# Patient Record
Sex: Female | Born: 1990 | Race: White | Hispanic: No | Marital: Married | Smoking: Former smoker
Health system: Southern US, Community
[De-identification: ages and names within clinical notes are randomized; demographics above are authoritative.]

## PROBLEM LIST (undated history)

## (undated) DIAGNOSIS — F32A Depression, unspecified: Secondary | ICD-10-CM

## (undated) DIAGNOSIS — F1111 Opioid abuse, in remission: Secondary | ICD-10-CM

## (undated) DIAGNOSIS — Z789 Other specified health status: Secondary | ICD-10-CM

## (undated) HISTORY — DX: Other specified health status: Z78.9

## (undated) HISTORY — DX: Depression, unspecified: F32.A

## (undated) HISTORY — DX: Opioid abuse, in remission: F11.11

---
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
## 2009-06-28 ENCOUNTER — Ambulatory Visit: Payer: Self-pay | Admitting: Psychiatry

## 2009-06-28 ENCOUNTER — Inpatient Hospital Stay (HOSPITAL_COMMUNITY): Admission: AD | Admit: 2009-06-28 | Discharge: 2009-06-28 | Payer: Self-pay | Admitting: Psychiatry

## 2009-06-29 ENCOUNTER — Inpatient Hospital Stay (HOSPITAL_COMMUNITY): Admission: EM | Admit: 2009-06-29 | Discharge: 2009-07-02 | Payer: Self-pay | Admitting: Emergency Medicine

## 2009-07-01 ENCOUNTER — Encounter (INDEPENDENT_AMBULATORY_CARE_PROVIDER_SITE_OTHER): Payer: Self-pay | Admitting: Internal Medicine

## 2009-07-02 ENCOUNTER — Inpatient Hospital Stay (HOSPITAL_COMMUNITY): Admission: AD | Admit: 2009-07-02 | Discharge: 2009-07-07 | Payer: Self-pay | Admitting: Psychiatry

## 2009-07-08 ENCOUNTER — Ambulatory Visit: Payer: Self-pay | Admitting: Psychiatry

## 2009-07-08 ENCOUNTER — Other Ambulatory Visit (HOSPITAL_COMMUNITY): Admission: RE | Admit: 2009-07-08 | Discharge: 2009-07-28 | Payer: Self-pay | Admitting: Psychiatry

## 2010-08-29 LAB — BASIC METABOLIC PANEL
BUN: 9 mg/dL (ref 6–23)
CO2: 22 mEq/L (ref 19–32)
CO2: 25 mEq/L (ref 19–32)
Calcium: 9.4 mg/dL (ref 8.4–10.5)
Chloride: 102 mEq/L (ref 96–112)
Chloride: 109 mEq/L (ref 96–112)
Creatinine, Ser: 0.84 mg/dL (ref 0.4–1.2)
GFR calc Af Amer: >60 mL/min (ref >60)
GFR calc Af Amer: >60 mL/min (ref >60)
GFR calc non Af Amer: >60 mL/min (ref >60)
Glucose, Bld: 95 mg/dL (ref 70–99)
Potassium: 3.4 mEq/L — ABNORMAL LOW (ref 3.5–5.1)
Potassium: 3.8 mEq/L (ref 3.5–5.1)
Sodium: 138 mEq/L (ref 135–145)

## 2010-08-29 LAB — CARDIAC PANEL(CRET KIN+CKTOT+MB+TROPI)
CK, MB: 1.9 ng/mL (ref 0.3–4.0)
Total CK: 178 U/L — ABNORMAL HIGH (ref 7–177)
Troponin I: 0.01 ng/mL (ref 0.00–0.06)

## 2010-08-29 LAB — D-DIMER, QUANTITATIVE: D-Dimer, Quant: <0.22 ug/mL-FEU (ref 0.00–0.48)

## 2010-08-29 LAB — URINE MICROSCOPIC-ADD ON

## 2010-08-29 LAB — URINALYSIS, ROUTINE W REFLEX MICROSCOPIC
Glucose, UA: NEGATIVE mg/dL
Ketones, ur: >80 mg/dL — AB
Leukocytes, UA: NEGATIVE
Nitrite: NEGATIVE
Protein, ur: 100 mg/dL — AB
Specific Gravity, Urine: 1.036 — ABNORMAL HIGH (ref 1.005–1.030)
Urobilinogen, UA: 1 mg/dL (ref 0.0–1.0)
pH: 6 (ref 5.0–8.0)

## 2010-08-29 LAB — COMPREHENSIVE METABOLIC PANEL
CO2: 25 mEq/L (ref 19–32)
Calcium: 8.4 mg/dL (ref 8.4–10.5)
Creatinine, Ser: 0.63 mg/dL (ref 0.4–1.2)
GFR calc Af Amer: >60 mL/min (ref >60)
GFR calc non Af Amer: >60 mL/min (ref >60)
Glucose, Bld: 114 mg/dL — ABNORMAL HIGH (ref 70–99)
Total Protein: 5.7 g/dL — ABNORMAL LOW (ref 6.0–8.3)

## 2010-08-29 LAB — CBC
HCT: 34.5 % — ABNORMAL LOW (ref 36.0–46.0)
Hemoglobin: 12 g/dL (ref 12.0–15.0)
MCHC: 34 g/dL (ref 30.0–36.0)
MCV: 91 fL (ref 78.0–100.0)
MCV: 91.5 fL (ref 78.0–100.0)
Platelets: 302 10*3/uL (ref 150–400)
RBC: 3.8 MIL/uL — ABNORMAL LOW (ref 3.87–5.11)
RDW: 12.3 % (ref 11.5–15.5)
WBC: 3.8 10*3/uL — ABNORMAL LOW (ref 4.0–10.5)

## 2010-08-29 LAB — RAPID URINE DRUG SCREEN, HOSP PERFORMED
Amphetamines: NOT DETECTED
Barbiturates: NOT DETECTED
Benzodiazepines: POSITIVE — AB
Cocaine: POSITIVE — AB
Opiates: NOT DETECTED
Tetrahydrocannabinol: POSITIVE — AB

## 2010-08-29 LAB — HEPATITIS PANEL, ACUTE: Hepatitis B Surface Ag: NEGATIVE

## 2010-08-29 LAB — DIFFERENTIAL
Basophils Absolute: 0.1 10*3/uL (ref 0.0–0.1)
Basophils Relative: 1 % (ref 0–1)
Eosinophils Absolute: 0 10*3/uL (ref 0.0–0.7)
Monocytes Relative: 10 % (ref 3–12)
Neutro Abs: 4.1 10*3/uL (ref 1.7–7.7)
Neutrophils Relative %: 66 % (ref 43–77)

## 2010-08-29 LAB — HEPATIC FUNCTION PANEL
AST: 20 U/L (ref 0–37)
Albumin: 3.3 g/dL — ABNORMAL LOW (ref 3.5–5.2)
Alkaline Phosphatase: 35 U/L — ABNORMAL LOW (ref 39–117)
Total Bilirubin: 1.3 mg/dL — ABNORMAL HIGH (ref 0.3–1.2)
Total Protein: 5.2 g/dL — ABNORMAL LOW (ref 6.0–8.3)

## 2010-08-29 LAB — HIV ANTIBODY (ROUTINE TESTING W REFLEX): HIV: NONREACTIVE

## 2010-08-29 LAB — POCT PREGNANCY, URINE: Preg Test, Ur: NEGATIVE

## 2010-08-29 LAB — TSH: TSH: 1.627 u[IU]/mL (ref 0.700–6.400)

## 2010-09-01 LAB — URINE DRUGS OF ABUSE SCREEN W ALC, ROUTINE (REF LAB)
Barbiturate Quant, Ur: NEGATIVE
Benzodiazepines.: NEGATIVE
Methadone: NEGATIVE
Phencyclidine (PCP): NEGATIVE
Propoxyphene: NEGATIVE

## 2011-09-17 ENCOUNTER — Emergency Department (HOSPITAL_COMMUNITY)
Admission: EM | Admit: 2011-09-17 | Discharge: 2011-09-17 | Disposition: A | Discharge status: Home / Self Care | Payer: Self-pay | Source: Home / Self Care | Attending: Family Medicine | Admitting: Family Medicine

## 2011-09-17 ENCOUNTER — Encounter (HOSPITAL_COMMUNITY): Payer: Self-pay | Admitting: *Deleted

## 2011-09-17 DIAGNOSIS — K645 Perianal venous thrombosis: Secondary | ICD-10-CM

## 2011-09-17 MED ORDER — TUCKS 50 % EX PADS: 1.0000 | MEDICATED_PAD | CUTANEOUS | Status: DC | PRN

## 2011-09-17 MED ORDER — DOCUSATE SODIUM 100 MG PO CAPS: 100.0000 mg | ORAL_CAPSULE | Freq: Two times a day (BID) | ORAL | Status: AC

## 2011-09-17 MED ORDER — HYDROCORTISONE ACE-PRAMOXINE 2.5-1 % RE CREA: TOPICAL_CREAM | Freq: Three times a day (TID) | RECTAL | Status: AC

## 2011-09-17 MED ORDER — POLYETHYLENE GLYCOL 3350 17 GM/SCOOP PO POWD: 17.0000 g | Freq: Every day | ORAL | Status: AC

## 2011-09-17 NOTE — ED Notes (Signed)
Pt with ? Hemorrhoid rectum onset Thursday - c/o mild discomfort - denies bleeding

## 2011-09-17 NOTE — Discharge Instructions (Signed)
Fiber Content in Foods Drinking plenty of fluids and consuming foods high in fiber can help with constipation. See the list below for the fiber content of some common foods. Starches and Grains / Dietary Fiber (g)  Cheerios, 1 cup / 3 g   Kellogg's Corn Flakes, 1 cup / 0.7 g   Rice Krispies, 1  cup / 0.3 g   Quaker Oat Life Cereal,  cup / 2.1 g   Oatmeal, instant (cooked),  cup / 2 g   Kellogg's Frosted Mini Wheats, 1 cup / 5.1 g   Rice, brown, long-grain (cooked), 1 cup / 3.5 g   Rice, white, long-grain (cooked), 1 cup / 0.6 g   Macaroni, cooked, enriched, 1 cup / 2.5 g  Legumes / Dietary Fiber (g)  Beans, baked, canned, plain or vegetarian,  cup / 5.2 g   Beans, kidney, canned,  cup / 6.8 g   Beans, pinto, dried (cooked),  cup / 7.7 g   Beans, pinto, canned,  cup / 5.5 g  Breads and Crackers / Dietary Fiber (g)  Graham crackers, plain or honey, 2 squares / 0.7 g   Saltine crackers, 3 squares / 0.3 g   Pretzels, plain, salted, 10 pieces / 1.8 g   Bread, whole-wheat, 1 slice / 1.9 g   Bread, white, 1 slice / 0.7 g   Bread, raisin, 1 slice / 1.2 g   Bagel, plain, 3 oz / 2 g   Tortilla, flour, 1 oz / 0.9 g   Tortilla, corn, 1 small / 1.5 g   Bun, hamburger or hotdog, 1 small / 0.9 g  Fruits / Dietary Fiber (g)  Apple, raw with skin, 1 medium / 4.4 g   Applesauce, sweetened,  cup / 1.5 g   Banana,  medium / 1.5 g   Grapes, 10 grapes / 0.4 g   Orange, 1 small / 2.3 g   Raisin, 1.5 oz / 1.6 g   Melon, 1 cup / 1.4 g  Vegetables / Dietary Fiber (g)  Green beans, canned,  cup / 1.3 g   Carrots (cooked),  cup / 2.3 g   Broccoli (cooked),  cup / 2.8 g   Peas, frozen (cooked),  cup / 4.4 g   Potatoes, mashed,  cup / 1.6 g   Lettuce, 1 cup / 0.5 g   Corn, canned,  cup / 1.6 g   Tomato,  cup / 1.1 g  Document Released: 10/15/2006 Document Revised: 05/18/2011 Document Reviewed: 12/10/2006 ExitCareHemorrhoids Hemorrhoids are  enlarged (dilated) veins around the rectum. There are 2 types of hemorrhoids, and the type of hemorrhoid is determined by its location. Internal hemorrhoids occur in the veins just inside the rectum.They are usually not painful, but they may bleed.However, they may poke through to the outside and become irritated and painful. External hemorrhoids involve the veins outside the anus and can be felt as a painful swelling or hard lump near the anus.They are often itchy and may crack and bleed. Sometimes clots will form in the veins. This makes them swollen and painful. These are called thrombosed hemorrhoids. CAUSES Causes of hemorrhoids include:  Pregnancy. This increases the pressure in the hemorrhoidal veins.   Constipation.   Straining to have a bowel movement.   Obesity.   Heavy lifting or other activity that caused you to strain.  TREATMENT Most of the time hemorrhoids improve in 1 to 2 weeks. However, if symptoms do not seem to be getting better  or if you have a lot of rectal bleeding, your caregiver may perform a procedure to help make the hemorrhoids get smaller or remove them completely.Possible treatments include:  Rubber band ligation. A rubber band is placed at the base of the hemorrhoid to cut off the circulation.   Sclerotherapy. A chemical is injected to shrink the hemorrhoid.   Infrared light therapy. Tools are used to burn the hemorrhoid.   Hemorrhoidectomy. This is surgical removal of the hemorrhoid.  HOME CARE INSTRUCTIONS   Increase fiber in your diet. Ask your caregiver about using fiber supplements.   Drink enough water and fluids to keep your urine clear or pale yellow.   Exercise regularly.   Go to the bathroom when you have the urge to have a bowel movement. Do not wait.   Avoid straining to have bowel movements.   Keep the anal area dry and clean.   Only take over-the-counter or prescription medicines for pain, discomfort, or fever as directed by your  caregiver.  If your hemorrhoids are thrombosed:  Take warm sitz baths for 20 to 30 minutes, 3 to 4 times per day.   If the hemorrhoids are very tender and swollen, place ice packs on the area as tolerated. Using ice packs between sitz baths may be helpful. Fill a plastic bag with ice. Place a towel between the bag of ice and your skin.   Medicated creams and suppositories may be used or applied as directed.   Do not use a donut-shaped pillow or sit on the toilet for long periods. This increases blood pooling and pain.  SEEK MEDICAL CARE IF:   You have increasing pain and swelling that is not controlled with your medicine.   You have uncontrolled bleeding.   You have difficulty or you are unable to have a bowel movement.   You have pain or inflammation outside the area of the hemorrhoids.   You have chills or an oral temperature above 102 F (38.9 C).  MAKE SURE YOU:   Understand these instructions.   Will watch your condition.   Will get help right away if you are not doing well or get worse.  Document Released: 05/26/2000 Document Revised: 05/18/2011 Document Reviewed: 10/01/2007 [facility] Patient Information 2012 [place], [place]. Patient Information 2012 [place], [place].

## 2011-09-17 NOTE — ED Provider Notes (Signed)
History     CSN: [id number]  Arrival date & time 09/17/11  1445   First MD Initiated Contact with Patient 09/17/11 1505      Chief Complaint  Patient presents with  . Hemorrhoids    (Consider location/radiation/quality/duration/timing/severity/associated sxs/prior treatment) The history is provided by the patient and a parent.   The patient reports rectal irritation since Thursday. She's tried compresses and several other over-the-counter medications without improvement. Because it's getting worse she decided to come in for evaluation. No rectal bleeding or blood in stool noted.   History reviewed. No pertinent past medical history.  History reviewed. No pertinent past surgical history.  History reviewed. No pertinent family history.  History  Substance Use Topics  . Smoking status: Current Everyday Smoker  . Smokeless tobacco: Not on file  . Alcohol Use: No    OB History    Grav Para Term Preterm Abortions TAB SAB Ect Mult Living                  Review of Systems  Gastrointestinal: Positive for rectal pain. Negative for vomiting, abdominal pain, blood in stool, abdominal distention and anal bleeding.    Allergies  Review of patient's allergies indicates no known allergies.  Home Medications  No current outpatient prescriptions on file.  BP 137/92  Pulse 93  Temp(Src) 98.7 F (37.1 C) (Oral)  Resp 18  SpO2 98%  LMP 08/30/2011  Physical Exam  Constitutional: She is oriented to person, place, and time. She appears well-developed.  HENT:  Head: Normocephalic.  Abdominal:       Rectal hemorrhoid present at 5:00 mildly thrombossed  Neurological: She is alert and oriented to person, place, and time. She has normal reflexes.  Skin: Skin is warm and dry. She is not diaphoretic.  Psychiatric: She has a normal mood and affect.    ED Course  Procedures (including critical care time)  Labs Reviewed - No data to display No results found.   Hemorrhoids Use  analpram apply 3 to 4 x aday miralalx daily  Colace 100 mg po q day  Use metamucil daily basis in the future    MDM          Hassan Rowan, MD 09/17/11 2147

## 2012-02-09 DIAGNOSIS — T1590XA Foreign body on external eye, part unspecified, unspecified eye, initial encounter: Secondary | ICD-10-CM | POA: Insufficient documentation

## 2012-02-09 DIAGNOSIS — F17200 Nicotine dependence, unspecified, uncomplicated: Secondary | ICD-10-CM | POA: Insufficient documentation

## 2012-02-09 DIAGNOSIS — S058X9A Other injuries of unspecified eye and orbit, initial encounter: Secondary | ICD-10-CM | POA: Insufficient documentation

## 2012-02-10 ENCOUNTER — Encounter (HOSPITAL_COMMUNITY): Payer: Self-pay | Admitting: Adult Health

## 2012-02-10 ENCOUNTER — Emergency Department (HOSPITAL_COMMUNITY)
Admission: EM | Admit: 2012-02-10 | Discharge: 2012-02-10 | Disposition: A | Discharge status: Home / Self Care | Payer: Self-pay | Attending: Emergency Medicine | Admitting: Emergency Medicine

## 2012-02-10 DIAGNOSIS — S0500XA Injury of conjunctiva and corneal abrasion without foreign body, unspecified eye, initial encounter: Secondary | ICD-10-CM

## 2012-02-10 MED ORDER — HYDROCODONE-ACETAMINOPHEN 5-325 MG PO TABS
1.0000 | ORAL_TABLET | Freq: Once | ORAL | Status: AC
  Administered 2012-02-10: 1 via ORAL
  Filled 2012-02-10: qty 1

## 2012-02-10 MED ORDER — TOBRAMYCIN 0.3 % OP OINT
TOPICAL_OINTMENT | Freq: Two times a day (BID) | OPHTHALMIC | Status: DC
  Administered 2012-02-10: 03:00:00 via OPHTHALMIC
  Filled 2012-02-10: qty 3.5

## 2012-02-10 MED ORDER — TETRACAINE HCL 0.5 % OP SOLN
2.0000 [drp] | Freq: Once | OPHTHALMIC | Status: AC
  Administered 2012-02-10: 2 [drp] via OPHTHALMIC

## 2012-02-10 MED ORDER — FLUORESCEIN SODIUM 1 MG OP STRP
1.0000 | ORAL_STRIP | Freq: Once | OPHTHALMIC | Status: AC
  Administered 2012-02-10: 1 via OPHTHALMIC
  Filled 2012-02-10: qty 1

## 2012-02-10 MED ORDER — HYDROCODONE-ACETAMINOPHEN 5-325 MG PO TABS: 1.0000 | ORAL_TABLET | ORAL | Status: AC | PRN

## 2012-02-10 NOTE — ED Notes (Signed)
Prescription given with discharge instructions.  

## 2012-02-10 NOTE — ED Provider Notes (Signed)
Medical screening examination/treatment/procedure(s) were performed by non-physician practitioner and as supervising physician I was immediately available for consultation/collaboration.    Vida Roller, MD 02/10/12 [phone]

## 2012-02-10 NOTE — ED Provider Notes (Signed)
History     CSN: [id number]  Arrival date & time 02/09/12  2347   First MD Initiated Contact with Patient 02/10/12 0046      Chief Complaint  Patient presents with  . Eye Injury   HPI  History provided by the patient. Patient is a 21 year old female with no significant PMH who presents with complaints of left eye injury and pain. Patient states she was lying in bed with her dog around 9 PM when her dog became excited and positive or face. Patient states that he hit her left eye and since that time she has had sharp pain to the eye. Pain is worse with bright lights. She denies any vision loss. There is some tearing of the eye. There is no bleeding. she denies any other complaints.    History reviewed. No pertinent past medical history.  History reviewed. No pertinent past surgical history.  History reviewed. No pertinent family history.  History  Substance Use Topics  . Smoking status: Current Everyday Smoker  . Smokeless tobacco: Not on file  . Alcohol Use: No    OB History    Grav Para Term Preterm Abortions TAB SAB Ect Mult Living                  Review of Systems  Eyes: Positive for photophobia, pain, discharge and redness.  Neurological: Positive for headaches. Negative for light-headedness.    Allergies  Review of patient's allergies indicates no known allergies.  Home Medications  No current outpatient prescriptions on file.  BP 117/76  Pulse 79  Temp 98 F (36.7 C) (Oral)  Resp 14  SpO2 97%  Physical Exam  Nursing note and vitals reviewed. Constitutional: She is oriented to person, place, and time. She appears well-developed and well-nourished. No distress.  HENT:  Head: Normocephalic.  Eyes: EOM are normal. Pupils are equal, round, and reactive to light. No foreign bodies found. No foreign body present in the left eye. Left conjunctiva is injected. Left conjunctiva has no hemorrhage.  Slit lamp exam:      The left eye shows corneal abrasion and  fluorescein uptake. The left eye shows no corneal flare, no corneal ulcer, no foreign body, no hyphema, no hypopyon and no anterior chamber bulge.    Cardiovascular: Normal rate and regular rhythm.   Pulmonary/Chest: Effort normal and breath sounds normal.  Neurological: She is alert and oriented to person, place, and time.  Skin: Skin is warm and dry. No rash noted.  Psychiatric: She has a normal mood and affect. Her behavior is normal.    ED Course  Procedures     1. Corneal abrasion       MDM  1:45AM patient seen and evaluated. Patient with corneal abrasion on fluorescein. Tobramycin drops provided. Patient given prescriptions for Norco. Patient given ophthalmology followup.        Angus Seller, [place] 02/10/12 [phone]

## 2012-02-10 NOTE — ED Notes (Signed)
Reports dog scratch in left eye at 2100 tonight. Left eye red and tearful-c/o blurred vision and pain.

## 2012-02-10 NOTE — ED Notes (Signed)
The pt has pain in her lt eye.  Tearing since she had her eye scratched by her dog earlier today.  Triage nurse used some type drops in her eye ??

## 2012-02-10 NOTE — ED Notes (Signed)
Fluorescein strip at bedside 

## 2012-11-04 ENCOUNTER — Encounter (HOSPITAL_COMMUNITY): Payer: Self-pay | Admitting: *Deleted

## 2012-11-04 ENCOUNTER — Emergency Department (INDEPENDENT_AMBULATORY_CARE_PROVIDER_SITE_OTHER)
Admission: EM | Admit: 2012-11-04 | Discharge: 2012-11-04 | Disposition: A | Discharge status: Home / Self Care | Payer: 59 | Source: Home / Self Care | Attending: Emergency Medicine | Admitting: Emergency Medicine

## 2012-11-04 DIAGNOSIS — B079 Viral wart, unspecified: Secondary | ICD-10-CM

## 2012-11-04 NOTE — ED Provider Notes (Signed)
Chief Complaint:   Chief Complaint  Patient presents with  . Nose Problem    History of Present Illness:   Jessica Huffman is a-year-old female who has had a nine-month history of 2 warts on her right nostril. Is gradually increasing in size and is somewhat irritated. They have not been bleeding. She tried putting some Compound W on them yesterday and this seemed to irritate them and make them worse. They're not inside the nose.  Review of Systems:  Other than noted above, the patient denies any of the following symptoms: Systemic:  No fevers, chills, sweats, weight loss or gain, fatigue, or tiredness. Eye:  No redness, pain, discharge, itching, blurred vision, or diplopia. ENT:  No headache, nasal congestion, sneezing, itching, epistaxis, ear pain, congestion, decreased hearing, ringing in ears, vertigo, or tinnitus.  No oral lesions, sore throat, pain on swallowing, or hoarseness. Neck:  No mass, tenderness or adenopathy. Lungs:  No coughing, wheezing, or shortness of breath. Skin:  No rash or itching.  PMFSH:  Past medical history, family history, social history, meds, and allergies were reviewed.  Physical Exam:   Vital signs:  BP 125/91  Pulse 83  Temp(Src) 98.5 F (36.9 C) (Oral)  Resp 16  SpO2 100% General:  Alert and oriented.  In no distress.  Skin warm and dry. Eye:  PERRL, full EOMs, lids and conjunctiva normal.   ENT:  TMs and canals clear.  Nasal mucosa not congested and without drainage.  Mucous membranes moist, no oral lesions, normal dentition, pharynx clear.  No cranial or facial pain to palplation. She has 2 small warts just on the entrance to the right nostril. A tiny one anteriorly measuring 1-2 mm in size and a larger one on the septal portion of the anterior nostril measuring 3-4 mm in size. Neck:  Supple, full ROM.  No adenopathy, tenderness or mass.  Thyroid normal. Lungs:  Breath sounds clear and equal bilaterally.  No wheezes, rales or rhonchi. Heart:  Rhythm  regular, without extrasystoles.  No gallops or murmers. Skin:  Clear, warm and dry.  Procedure Note:  Verbal informed consent was obtained from the patient.  Risks and benefits were outlined with the patient.  Patient understands and accepts these risks.  Identity of the patient was confirmed verbally and by armband.    Procedure was performed as follows:  The 2 small warts were prepped with alcohol and anesthetized with a small amount of 2% Xylocaine without epinephrine and the warts were then cauterized with electrocautery. Patient tolerated this procedure well. Antibiotic ointment was applied and she was instructed in wound care.  Patient tolerated the procedure well without any immediate complications.  Assessment:  The encounter diagnosis was Warts.  These appear to be simple warts and should respond well to electrocautery. I gave her the name of Dr. Christia Reading to followup with if they recur.  Plan:   1.  The following meds were prescribed:   Discharge Medication List as of 11/04/2012  2:33 PM     2.  The patient was instructed in symptomatic care and handouts were given. 3.  The patient was told to return if becoming worse in any way, if no better in 3 or 4 days, and given some red flag symptoms such as increasing pain, evidence of infection such as swelling or purulent drainage that would indicate earlier return. 4.  Follow up with Dr. Christia Reading if these recur.    Reuben Likes, MD 11/04/12 [phone]

## 2012-11-04 NOTE — ED Notes (Signed)
Pt  Reports  She  Has  Had  A   Growth  In  Her  Nose       X   9  Months    She  staes  She  Put   Compound  w  Inside  Her  Nose  Which made  It  Worse  She  Now  Reports  Pain in the  Affected      nare

## 2012-12-25 ENCOUNTER — Encounter (HOSPITAL_COMMUNITY): Payer: Self-pay | Admitting: Emergency Medicine

## 2012-12-25 ENCOUNTER — Emergency Department (HOSPITAL_COMMUNITY)
Admission: EM | Admit: 2012-12-25 | Discharge: 2012-12-25 | Disposition: A | Discharge status: Home / Self Care | Payer: 59 | Attending: Emergency Medicine | Admitting: Emergency Medicine

## 2012-12-25 DIAGNOSIS — R1084 Generalized abdominal pain: Secondary | ICD-10-CM | POA: Insufficient documentation

## 2012-12-25 DIAGNOSIS — J3489 Other specified disorders of nose and nasal sinuses: Secondary | ICD-10-CM | POA: Insufficient documentation

## 2012-12-25 DIAGNOSIS — R197 Diarrhea, unspecified: Secondary | ICD-10-CM | POA: Insufficient documentation

## 2012-12-25 DIAGNOSIS — R6883 Chills (without fever): Secondary | ICD-10-CM | POA: Insufficient documentation

## 2012-12-25 DIAGNOSIS — R109 Unspecified abdominal pain: Secondary | ICD-10-CM

## 2012-12-25 DIAGNOSIS — R110 Nausea: Secondary | ICD-10-CM | POA: Insufficient documentation

## 2012-12-25 DIAGNOSIS — F17200 Nicotine dependence, unspecified, uncomplicated: Secondary | ICD-10-CM | POA: Insufficient documentation

## 2012-12-25 DIAGNOSIS — N898 Other specified noninflammatory disorders of vagina: Secondary | ICD-10-CM | POA: Insufficient documentation

## 2012-12-25 DIAGNOSIS — Z3202 Encounter for pregnancy test, result negative: Secondary | ICD-10-CM | POA: Insufficient documentation

## 2012-12-25 DIAGNOSIS — Z79899 Other long term (current) drug therapy: Secondary | ICD-10-CM | POA: Insufficient documentation

## 2012-12-25 DIAGNOSIS — R61 Generalized hyperhidrosis: Secondary | ICD-10-CM | POA: Insufficient documentation

## 2012-12-25 DIAGNOSIS — N949 Unspecified condition associated with female genital organs and menstrual cycle: Secondary | ICD-10-CM | POA: Insufficient documentation

## 2012-12-25 DIAGNOSIS — K625 Hemorrhage of anus and rectum: Secondary | ICD-10-CM | POA: Insufficient documentation

## 2012-12-25 LAB — CBC WITH DIFFERENTIAL/PLATELET
Basophils Absolute: 0 10*3/uL (ref 0.0–0.1)
Basophils Relative: 0 % (ref 0–1)
Eosinophils Absolute: 0.1 10*3/uL (ref 0.0–0.7)
Eosinophils Relative: 2 % (ref 0–5)
HCT: 41.3 % (ref 36.0–46.0)
Hemoglobin: 13.9 g/dL (ref 12.0–15.0)
Lymphocytes Relative: 15 % (ref 12–46)
Lymphs Abs: 0.9 10*3/uL (ref 0.7–4.0)
MCH: 29.2 pg (ref 26.0–34.0)
MCHC: 33.7 g/dL (ref 30.0–36.0)
MCV: 86.8 fL (ref 78.0–100.0)
Monocytes Absolute: 0.5 10*3/uL (ref 0.1–1.0)
Monocytes Relative: 7 % (ref 3–12)
Neutro Abs: 4.9 10*3/uL (ref 1.7–7.7)
Neutrophils Relative %: 76 % (ref 43–77)
Platelets: 244 10*3/uL (ref 150–400)
RBC: 4.76 MIL/uL (ref 3.87–5.11)
RDW: 13.1 % (ref 11.5–15.5)
WBC: 6.4 10*3/uL (ref 4.0–10.5)

## 2012-12-25 LAB — URINALYSIS, ROUTINE W REFLEX MICROSCOPIC
Bilirubin Urine: NEGATIVE
Glucose, UA: NEGATIVE mg/dL
Hgb urine dipstick: NEGATIVE
Ketones, ur: NEGATIVE mg/dL
Leukocytes, UA: NEGATIVE
Nitrite: NEGATIVE
Protein, ur: NEGATIVE mg/dL
Specific Gravity, Urine: 1.008 (ref 1.005–1.030)
Urobilinogen, UA: 0.2 mg/dL (ref 0.0–1.0)
pH: 6.5 (ref 5.0–8.0)

## 2012-12-25 LAB — POCT I-STAT, CHEM 8
Creatinine, Ser: 0.8 mg/dL (ref 0.50–1.10)
Glucose, Bld: 98 mg/dL (ref 70–99)
HCT: 46 % (ref 36.0–46.0)
Hemoglobin: 15.6 g/dL — ABNORMAL HIGH (ref 12.0–15.0)
Potassium: 3.9 mEq/L (ref 3.5–5.1)
Sodium: 139 mEq/L (ref 135–145)
TCO2: 23 mmol/L (ref 0–100)

## 2012-12-25 LAB — WET PREP, GENITAL
Clue Cells Wet Prep HPF POC: NONE SEEN
Trich, Wet Prep: NONE SEEN
Yeast Wet Prep HPF POC: NONE SEEN

## 2012-12-25 LAB — PREGNANCY, URINE: Preg Test, Ur: NEGATIVE

## 2012-12-25 MED ORDER — MORPHINE SULFATE 4 MG/ML IJ SOLN
6.0000 mg | Freq: Once | INTRAMUSCULAR | Status: DC
  Filled 2012-12-25: qty 2

## 2012-12-25 MED ORDER — DICYCLOMINE HCL 20 MG PO TABS: 20.0000 mg | ORAL_TABLET | Freq: Two times a day (BID) | ORAL | Status: DC

## 2012-12-25 MED ORDER — MORPHINE SULFATE 4 MG/ML IJ SOLN: 4.0000 mg | Freq: Once | INTRAMUSCULAR | Status: DC

## 2012-12-25 MED ORDER — MORPHINE SULFATE 4 MG/ML IJ SOLN
6.0000 mg | Freq: Once | INTRAMUSCULAR | Status: AC
  Administered 2012-12-25: 6 mg via INTRAVENOUS

## 2012-12-25 NOTE — ED Notes (Signed)
Pt reports nasal congestion all day yesterday with abdominal cramping that started at 9pm.  Pt reports cramps usually last 15 minutes, but this time continued longer.  Pt noticed at midnight she had blood in stools with diarrhea.  Nausea present, denies vomiting.

## 2012-12-25 NOTE — ED Provider Notes (Signed)
History    CSN: [id number] Arrival date & time 12/25/12  0906  First MD Initiated Contact with Patient 12/25/12 0913     Chief Complaint  Patient presents with  . Nasal Congestion  . Abdominal Cramping  . Rectal Bleeding   (Consider location/radiation/quality/duration/timing/severity/associated sxs/prior Treatment) HPI Pt is a 22yo female presenting with abdominal cramping and diarrhea that started around 2100 last night.  Pt states cramping is constant, 7/10, diffuse but worse in lower abdomen.  Pt also reports blood in stool and "small amount coming out in toilet."  States the act of having a BM does not hurt. Reports some cold sweats but did not take temperature.  Also c/o mild nausea and head cold.  Has not tried anything for abdominal cramping or diarrhea. Pt reports having Implanon in place, due to come out Aug 11th.  Denies urinary symptoms. Denies previous hx of similar pain.  Reports eating same meal as family last night but no one else is sick.  Denies recent travel.   History reviewed. No pertinent past medical history. History reviewed. No pertinent past surgical history. No family history on file. History  Substance Use Topics  . Smoking status: Current Every Day Smoker -- 0.50 packs/day    Types: Cigarettes  . Smokeless tobacco: Not on file  . Alcohol Use: Yes   OB History   Grav Para Term Preterm Abortions TAB SAB Ect Mult Living                 Review of Systems  Constitutional: Positive for chills and diaphoresis. Negative for fever and fatigue.  Gastrointestinal: Positive for nausea ( mild), abdominal pain (diffuse, worse in lower, cramping ), blood in stool and anal bleeding. Negative for vomiting.  Genitourinary: Positive for pelvic pain. Negative for dysuria, urgency, hematuria, flank pain, vaginal bleeding, vaginal discharge and vaginal pain.  All other systems reviewed and are negative.    Allergies  Review of patient's allergies indicates no known  allergies.  Home Medications   Current Outpatient Rx  Name  Route  Sig  Dispense  Refill  . OVER THE COUNTER MEDICATION   Oral   Take 10 mLs by mouth daily as needed (congestion).         . BusPIRone HCl (BUSPAR PO)   Oral   Take by mouth.         . dicyclomine (BENTYL) 20 MG tablet   Oral   Take 1 tablet (20 mg total) by mouth 2 (two) times daily.   20 tablet   0   . ValACYclovir HCl (VALTREX PO)   Oral   Take by mouth.          BP 125/93  Pulse 76  Temp(Src) 98.3 F (36.8 C) (Oral)  Resp 18  Ht 5\' 4"  (1.626 m)  Wt 137 lb (62.143 kg)  BMI 23.5 kg/m2  SpO2 99% Physical Exam  Nursing note and vitals reviewed. Constitutional: She appears well-developed and well-nourished.  Lying on exam bed, appears mildly uncomfortable   HENT:  Head: Normocephalic and atraumatic.  Eyes: Conjunctivae are normal. No scleral icterus.  Neck: Normal range of motion. Neck supple.  Cardiovascular: Normal rate, regular rhythm and normal heart sounds.   Pulmonary/Chest: Effort normal and breath sounds normal. No respiratory distress. She has no wheezes. She has no rales. She exhibits no tenderness.  Abdominal: Soft. Bowel sounds are normal. She exhibits no distension and no mass. There is tenderness ( diffuse, greatest in lower abdomen).  There is no rebound and no guarding.  Genitourinary: Rectum normal and uterus normal. Rectal exam shows no external hemorrhoid, no internal hemorrhoid, no fissure, no mass, no tenderness and anal tone normal. Guaiac negative stool. Pelvic exam was performed with patient supine. Cervix exhibits no motion tenderness, no discharge and no friability. Right adnexum displays no mass, no tenderness and no fullness. Left adnexum displays no mass, no tenderness and no fullness. Vaginal discharge ( thick white) found.  Musculoskeletal: Normal range of motion.  Neurological: She is alert.  Skin: Skin is warm and dry. She is not diaphoretic.    ED Course   Procedures (including critical care time) Labs Reviewed  WET PREP, GENITAL - Abnormal; Notable for the following:    WBC, Wet Prep HPF POC FEW (*)    All other components within normal limits  POCT I-STAT, CHEM 8 - Abnormal; Notable for the following:    BUN 3 (*)    Hemoglobin 15.6 (*)    All other components within normal limits  GC/CHLAMYDIA PROBE AMP  CBC WITH DIFFERENTIAL  URINALYSIS, ROUTINE W REFLEX MICROSCOPIC  PREGNANCY, URINE  OCCULT BLOOD X 1 CARD TO LAB, STOOL  OCCULT BLOOD, POC DEVICE   No results found. 1. Abdominal cramping     MDM  DDx: diverticulitis, gastroenteritis, hemorrhoids, gynecologic issues  Labs: unremarkable.  Pelvic exam unremarkable.  Pt states she does feel better after 6mg  of morphine.  Wet prep: nl. Vitals are WNL, pain easily managed, not concerning for surgical abdomen.  Pt is well hydrated.  Will discharge home. Likely due to viral enteritis.  Rx: bentyl.  Will discharge pt home and have her f/u with Dr. Tiburcio Pea in 2-3 days if symptoms not improving.  Return precautions given. Pt verbalized understanding and agreement with tx plan. Vitals: unremarkable. Discharged in stable condition.    Discussed pt with attending during ED encounter.   Junius Finner, PA-C 12/25/12 1204

## 2012-12-25 NOTE — ED Notes (Signed)
Pt ambulated to the restroom without difficulty for bowel movement.

## 2012-12-25 NOTE — ED Notes (Signed)
PA O'Malley at bedside for hemoccult. 

## 2012-12-25 NOTE — ED Notes (Signed)
MD at bedside. 

## 2012-12-26 LAB — GC/CHLAMYDIA PROBE AMP
CT Probe RNA: NEGATIVE
GC Probe RNA: NEGATIVE

## 2012-12-30 NOTE — ED Provider Notes (Signed)
Medical screening examination/treatment/procedure(s) were performed by non-physician practitioner and as supervising physician I was immediately available for consultation/collaboration.   Suzi Roots, MD 12/30/12 [phone]

## 2013-05-29 ENCOUNTER — Encounter (HOSPITAL_COMMUNITY): Payer: Self-pay | Admitting: Emergency Medicine

## 2013-05-29 DIAGNOSIS — Z3202 Encounter for pregnancy test, result negative: Secondary | ICD-10-CM | POA: Insufficient documentation

## 2013-05-29 DIAGNOSIS — R1110 Vomiting, unspecified: Secondary | ICD-10-CM | POA: Insufficient documentation

## 2013-05-29 DIAGNOSIS — R1084 Generalized abdominal pain: Secondary | ICD-10-CM | POA: Insufficient documentation

## 2013-05-29 DIAGNOSIS — R197 Diarrhea, unspecified: Secondary | ICD-10-CM | POA: Insufficient documentation

## 2013-05-29 DIAGNOSIS — R51 Headache: Secondary | ICD-10-CM | POA: Insufficient documentation

## 2013-05-29 DIAGNOSIS — F17200 Nicotine dependence, unspecified, uncomplicated: Secondary | ICD-10-CM | POA: Insufficient documentation

## 2013-05-29 LAB — URINALYSIS, ROUTINE W REFLEX MICROSCOPIC
Hgb urine dipstick: NEGATIVE
Nitrite: NEGATIVE
Protein, ur: NEGATIVE mg/dL
Urobilinogen, UA: 0.2 mg/dL (ref 0.0–1.0)

## 2013-05-29 LAB — COMPREHENSIVE METABOLIC PANEL
AST: 20 U/L (ref 0–37)
BUN: 8 mg/dL (ref 6–23)
CO2: 27 mEq/L (ref 19–32)
Calcium: 9.3 mg/dL (ref 8.4–10.5)
Chloride: 101 mEq/L (ref 96–112)
Creatinine, Ser: 0.64 mg/dL (ref 0.50–1.10)
GFR calc Af Amer: >90 mL/min (ref >90)
GFR calc non Af Amer: >90 mL/min (ref >90)
Glucose, Bld: 96 mg/dL (ref 70–99)
Total Bilirubin: 0.7 mg/dL (ref 0.3–1.2)

## 2013-05-29 LAB — CBC WITH DIFFERENTIAL/PLATELET
Eosinophils Relative: 4 % (ref 0–5)
HCT: 39.1 % (ref 36.0–46.0)
Hemoglobin: 13.6 g/dL (ref 12.0–15.0)
Lymphocytes Relative: 46 % (ref 12–46)
MCV: 86.3 fL (ref 78.0–100.0)
Monocytes Absolute: 0.4 10*3/uL (ref 0.1–1.0)
Monocytes Relative: 7 % (ref 3–12)
Neutro Abs: 2.1 10*3/uL (ref 1.7–7.7)
WBC: 5 10*3/uL (ref 4.0–10.5)

## 2013-05-29 NOTE — ED Notes (Signed)
Pt. reports nausea , vomitting and diarrhea with intermittent headache onset yesterday . Denies fever or chills.

## 2013-05-30 ENCOUNTER — Emergency Department (HOSPITAL_COMMUNITY)
Admission: EM | Admit: 2013-05-30 | Discharge: 2013-05-30 | Disposition: A | Discharge status: Home / Self Care | Payer: 59 | Attending: Emergency Medicine | Admitting: Emergency Medicine

## 2013-05-30 DIAGNOSIS — R1110 Vomiting, unspecified: Secondary | ICD-10-CM

## 2013-05-30 LAB — POCT PREGNANCY, URINE: Preg Test, Ur: NEGATIVE

## 2013-05-30 MED ORDER — METOCLOPRAMIDE HCL 10 MG PO TABS: 10.0000 mg | ORAL_TABLET | Freq: Four times a day (QID) | ORAL | Status: DC | PRN

## 2013-05-30 MED ORDER — LOPERAMIDE HCL 2 MG PO CAPS: ORAL_CAPSULE | ORAL | Status: DC

## 2013-05-30 MED ORDER — ONDANSETRON 8 MG PO TBDP: ORAL_TABLET | ORAL | Status: DC

## 2013-05-30 NOTE — ED Provider Notes (Signed)
CSN: [id number]     Arrival date & time 05/29/13  2147 History   First MD Initiated Contact with Patient 05/30/13 0157     Chief Complaint  Patient presents with  . Emesis  . Diarrhea   (Consider location/radiation/quality/duration/timing/severity/associated sxs/prior Treatment) HPI 2 days multiple episodes of nonbloody vomiting diarrhea, no constant or localized abdominal pain, has intermittent diffuse cramps, no abdominal pain now, no dysuria/vag bleed/ vag discharge. No treatment PTA. Only mild headache, not sudden or severe.  HCG negative  History reviewed. No pertinent past medical history. History reviewed. No pertinent past surgical history. No family history on file. History  Substance Use Topics  . Smoking status: Current Every Day Smoker -- 0.50 packs/day    Types: Cigarettes  . Smokeless tobacco: Not on file  . Alcohol Use: Yes   OB History   Grav Para Term Preterm Abortions TAB SAB Ect Mult Living                 Review of Systems 10 Systems reviewed and are negative for acute change except as noted in the HPI. Allergies  Review of patient's allergies indicates no known allergies.  Home Medications   Current Outpatient Rx  Name  Route  Sig  Dispense  Refill  . ibuprofen (ADVIL,MOTRIN) 200 MG tablet   Oral   Take 400 mg by mouth every 6 (six) hours as needed for headache.          . loperamide (IMODIUM) 2 MG capsule      Take two tabs po initially, then one tab after each loose stool: max 8 tabs in 24 hours   12 capsule   0   . metoCLOPramide (REGLAN) 10 MG tablet   Oral   Take 1 tablet (10 mg total) by mouth every 6 (six) hours as needed for nausea (nausea/headache).   6 tablet   0   . ondansetron (ZOFRAN ODT) 8 MG disintegrating tablet      8mg  ODT q4 hours prn nausea   4 tablet   0    BP 109/79  Pulse 88  Temp(Src) 97.8 F (36.6 C) (Oral)  Resp 16  Wt 130 lb 6 oz (59.138 kg)  SpO2 96% Physical Exam  Nursing note and vitals  reviewed. Constitutional:  Awake, alert, nontoxic appearance.  HENT:  Head: Atraumatic.  Eyes: Right eye exhibits no discharge. Left eye exhibits no discharge.  Neck: Neck supple.  Cardiovascular: Normal rate and regular rhythm.   No murmur heard. Pulmonary/Chest: Effort normal and breath sounds normal. No respiratory distress. She has no wheezes. She exhibits no tenderness.  Abdominal: Soft. Bowel sounds are normal. She exhibits no distension and no mass. There is no tenderness. There is no rebound and no guarding.  Musculoskeletal: She exhibits no tenderness.  Baseline ROM, no obvious new focal weakness.  Neurological: She is alert.  Mental status and motor strength appears baseline for patient and situation.  Skin: No rash noted.  Psychiatric: She has a normal mood and affect.    ED Course  Procedures (including critical care time) Patient informed of clinical course, understand medical decision-making process, and agree with plan. Labs Review Labs Reviewed  URINALYSIS, ROUTINE W REFLEX MICROSCOPIC - Abnormal; Notable for the following:    APPearance CLOUDY (*)    All other components within normal limits  CBC WITH DIFFERENTIAL  COMPREHENSIVE METABOLIC PANEL  POCT PREGNANCY, URINE   Imaging Review No results found.  EKG Interpretation   None  MDM   1. Vomiting and diarrhea    I doubt any other EMC precluding discharge at this time including, but not necessarily limited to the following:SBI.    Hurman Horn, MD 05/30/13 2220

## 2013-05-30 NOTE — ED Notes (Signed)
Pt alert, denies pain.  Reports periods of nausea/vomiting x 2 today. Unclear why, has increased stress with father beginning chemo on Monday.

## 2013-11-01 ENCOUNTER — Encounter (HOSPITAL_COMMUNITY): Payer: Self-pay | Admitting: Emergency Medicine

## 2013-11-01 ENCOUNTER — Emergency Department (INDEPENDENT_AMBULATORY_CARE_PROVIDER_SITE_OTHER)
Admission: EM | Admit: 2013-11-01 | Discharge: 2013-11-01 | Disposition: A | Discharge status: Home / Self Care | Payer: Self-pay | Source: Home / Self Care | Attending: Family Medicine | Admitting: Family Medicine

## 2013-11-01 DIAGNOSIS — K529 Noninfective gastroenteritis and colitis, unspecified: Secondary | ICD-10-CM

## 2013-11-01 DIAGNOSIS — K5289 Other specified noninfective gastroenteritis and colitis: Secondary | ICD-10-CM

## 2013-11-01 MED ORDER — ONDANSETRON 4 MG PO TBDP
4.0000 mg | ORAL_TABLET | Freq: Once | ORAL | Status: AC
  Administered 2013-11-01: 4 mg via ORAL

## 2013-11-01 MED ORDER — ONDANSETRON HCL 4 MG PO TABS: 4.0000 mg | ORAL_TABLET | Freq: Four times a day (QID) | ORAL | Status: DC

## 2013-11-01 MED ORDER — ONDANSETRON 4 MG PO TBDP
ORAL_TABLET | ORAL | Status: AC
  Filled 2013-11-01: qty 1

## 2013-11-01 NOTE — ED Provider Notes (Signed)
CSN: [id number]     Arrival date & time 11/01/13  1633 History   First MD Initiated Contact with Patient 11/01/13 1739     Chief Complaint  Patient presents with  . GI Problem   (Consider location/radiation/quality/duration/timing/severity/associated sxs/prior Treatment) Patient is a 23 y.o. female presenting with GI illness. The history is provided by the patient and a parent.  GI Problem This is a new problem. The current episode started more than 1 week ago. The problem has been gradually improving. Pertinent negatives include no abdominal pain. Associated symptoms comments: Mother with similar n/v/d. Pt works with public.[employer]    History reviewed. No pertinent past medical history. History reviewed. No pertinent past surgical history. No family history on file. History  Substance Use Topics  . Smoking status: Current Every Day Smoker -- 0.50 packs/day    Types: Cigarettes  . Smokeless tobacco: Not on file  . Alcohol Use: Yes   OB History   Grav Para Term Preterm Abortions TAB SAB Ect Mult Living                 Review of Systems  Constitutional: Negative.  Negative for fever.  HENT: Negative.   Respiratory: Negative.   Cardiovascular: Negative.   Gastrointestinal: Positive for nausea and diarrhea. Negative for vomiting, abdominal pain, blood in stool, abdominal distention and anal bleeding.  Genitourinary: Negative.     Allergies  Review of patient's allergies indicates no known allergies.  Home Medications   Prior to Admission medications   Medication Sig Start Date End Date Taking? Authorizing Provider  ibuprofen (ADVIL,MOTRIN) 200 MG tablet Take 400 mg by mouth every 6 (six) hours as needed for headache.     Historical Provider, MD  loperamide (IMODIUM) 2 MG capsule Take two tabs po initially, then one tab after each loose stool: max 8 tabs in 24 hours 05/30/13   Hurman Horn, MD  metoCLOPramide (REGLAN) 10 MG tablet Take 1 tablet (10 mg total) by mouth every 6 (six)  hours as needed for nausea (nausea/headache). 05/30/13   Hurman Horn, MD  ondansetron (ZOFRAN ODT) 8 MG disintegrating tablet 8mg  ODT q4 hours prn nausea 05/30/13   Hurman Horn, MD   BP 128/77  Pulse 85  Temp(Src) 97.6 F (36.4 C) (Oral)  SpO2 97%  LMP 10/25/2013 Physical Exam  Nursing note and vitals reviewed. Constitutional: She is oriented to person, place, and time. She appears well-developed and well-nourished.  HENT:  Mouth/Throat: Oropharynx is clear and moist.  Neck: Normal range of motion. Neck supple.  Pulmonary/Chest: Breath sounds normal.  Abdominal: Soft. She exhibits no distension and no mass. Bowel sounds are increased. There is tenderness in the epigastric area. There is no rigidity, no rebound, no guarding, no CVA tenderness and no tenderness at McBurney's point.  Lymphadenopathy:    She has no cervical adenopathy.  Neurological: She is alert and oriented to person, place, and time.  Skin: Skin is warm and dry.    ED Course  Procedures (including critical care time) Labs Review Labs Reviewed - No data to display  Imaging Review No results found.   MDM   1. Gastroenteritis, acute        Linna Hoff, MD 11/01/13 1816

## 2013-11-01 NOTE — Discharge Instructions (Signed)
Clear liquid , bland diet tonight as tolerated, advance on sun as improved, use medicine as needed, imodium for diarrhea.  return or see your doctor if any problems.

## 2014-04-23 ENCOUNTER — Emergency Department (INDEPENDENT_AMBULATORY_CARE_PROVIDER_SITE_OTHER)
Admission: EM | Admit: 2014-04-23 | Discharge: 2014-04-23 | Disposition: A | Discharge status: Home / Self Care | Payer: Self-pay | Source: Home / Self Care | Attending: Family Medicine | Admitting: Family Medicine

## 2014-04-23 ENCOUNTER — Encounter (HOSPITAL_COMMUNITY): Payer: Self-pay

## 2014-04-23 DIAGNOSIS — H6122 Impacted cerumen, left ear: Secondary | ICD-10-CM

## 2014-04-23 DIAGNOSIS — H6092 Unspecified otitis externa, left ear: Secondary | ICD-10-CM

## 2014-04-23 MED ORDER — NEOMYCIN-POLYMYXIN-HC 3.5-10000-1 OT SUSP: 4.0000 [drp] | Freq: Three times a day (TID) | OTIC | Status: DC

## 2014-04-23 NOTE — ED Provider Notes (Signed)
CSN: [id number]     Arrival date & time 04/23/14  1857 History   None    Chief Complaint  Patient presents with  . Otalgia   (Consider location/radiation/quality/duration/timing/severity/associated sxs/prior Treatment) HPI         23 year old female presents for evaluation of difficulty hearing out of her left ear and left ear pain. This is started 2 days ago and has been getting gradually worse. It feels like when she used to get swimmer's ear infections frequently when she was younger. No fever, chills, NVD. No recent illness. No recent travel or sick contacts. She has multiple ear piercings but none are recent.  History reviewed. No pertinent past medical history. History reviewed. No pertinent past surgical history. No family history on file. History  Substance Use Topics  . Smoking status: Current Every Day Smoker -- 0.50 packs/day    Types: Cigarettes  . Smokeless tobacco: Not on file  . Alcohol Use: Yes   OB History    No data available     Review of Systems  HENT: Positive for ear pain.   All other systems reviewed and are negative.   Allergies  Review of patient's allergies indicates no known allergies.  Home Medications   Prior to Admission medications   Medication Sig Start Date End Date Taking? Authorizing Provider  ibuprofen (ADVIL,MOTRIN) 200 MG tablet Take 400 mg by mouth every 6 (six) hours as needed for headache.     Historical Provider, MD  loperamide (IMODIUM) 2 MG capsule Take two tabs po initially, then one tab after each loose stool: max 8 tabs in 24 hours 05/30/13   Hurman HornJohn M Bednar, MD  metoCLOPramide (REGLAN) 10 MG tablet Take 1 tablet (10 mg total) by mouth every 6 (six) hours as needed for nausea (nausea/headache). 05/30/13   Hurman HornJohn M Bednar, MD  neomycin-polymyxin-hydrocortisone (CORTISPORIN) 3.5-10000-1 otic suspension Place 4 drops into the left ear 3 (three) times daily. 04/23/14   Graylon GoodZachary H Jenavee Laguardia, PA-C  ondansetron (ZOFRAN ODT) 8 MG disintegrating  tablet 8mg  ODT q4 hours prn nausea 05/30/13   Hurman HornJohn M Bednar, MD  ondansetron (ZOFRAN) 4 MG tablet Take 1 tablet (4 mg total) by mouth every 6 (six) hours. Prn n/v 11/01/13   Linna HoffJames D Kindl, MD   BP 109/56 mmHg  Pulse 88  Temp(Src) 98.9 F (37.2 C) (Oral)  Resp 16  SpO2 100% Physical Exam  Constitutional: She is oriented to person, place, and time. Vital signs are normal. She appears well-developed and well-nourished. No distress.  HENT:  Head: Normocephalic and atraumatic.  Right Ear: Tympanic membrane, external ear and ear canal normal.  Left Ear: There is drainage (the canal is swollen, slightly erythematous. Is occluded with cerumen.).  Nose: Nose normal.  Mouth/Throat: Oropharynx is clear and moist. No oropharyngeal exudate.  Pulmonary/Chest: Effort normal. No respiratory distress.  Lymphadenopathy:    She has no cervical adenopathy.  Neurological: She is alert and oriented to person, place, and time. She has normal strength. Coordination normal.  Skin: Skin is warm and dry. No rash noted. She is not diaphoretic.  Psychiatric: She has a normal mood and affect. Judgment normal.  Nursing note and vitals reviewed.   ED Course  Procedures (including critical care time) Labs Review Labs Reviewed - No data to display  Imaging Review No results found.   MDM   1. Otitis externa, left   2. Cerumen impaction, left    Cerumen rinsed out, there is some canal swelling but the tympanic  membrane is normal. Treat with Cortisporin drops. Follow-up when necessary if worsening   Meds ordered this encounter  Medications  . neomycin-polymyxin-hydrocortisone (CORTISPORIN) 3.5-10000-1 otic suspension    Sig: Place 4 drops into the left ear 3 (three) times daily.    Dispense:  10 mL    Refill:  0    Order Specific Question:  Supervising Provider    Answer:  Bradd CanaryKINDL, JAMES D [[id number]]       Graylon GoodZachary H Lugenia Assefa, PA-C 04/23/14 2034

## 2014-04-23 NOTE — ED Notes (Signed)
Patient complains of having left ear pain for the past two days Patient did state when younger she was prone to ear infections

## 2014-04-23 NOTE — Discharge Instructions (Signed)
Otitis Externa Otitis externa is a bacterial or fungal infection of the outer ear canal. This is the area from the eardrum to the outside of the ear. Otitis externa is sometimes called "swimmer's ear." CAUSES  Possible causes of infection include:  Swimming in dirty water.  Moisture remaining in the ear after swimming or bathing.  Mild injury (trauma) to the ear.  Objects stuck in the ear (foreign body).  Cuts or scrapes (abrasions) on the outside of the ear. SIGNS AND SYMPTOMS  The first symptom of infection is often itching in the ear canal. Later signs and symptoms may include swelling and redness of the ear canal, ear pain, and yellowish-white fluid (pus) coming from the ear. The ear pain may be worse when pulling on the earlobe. DIAGNOSIS  Your health care provider will perform a physical exam. A sample of fluid may be taken from the ear and examined for bacteria or fungi. TREATMENT  Antibiotic ear drops are often given for 10 to 14 days. Treatment may also include pain medicine or corticosteroids to reduce itching and swelling. HOME CARE INSTRUCTIONS   Apply antibiotic ear drops to the ear canal as prescribed by your health care provider.  Take medicines only as directed by your health care provider.  If you have diabetes, follow any additional treatment instructions from your health care provider.  Keep all follow-up visits as directed by your health care provider. PREVENTION   Keep your ear dry. Use the corner of a towel to absorb water out of the ear canal after swimming or bathing.  Avoid scratching or putting objects inside your ear. This can damage the ear canal or remove the protective wax that lines the canal. This makes it easier for bacteria and fungi to grow.  Avoid swimming in lakes, polluted water, or poorly chlorinated pools.  You may use ear drops made of rubbing alcohol and vinegar after swimming. Combine equal parts of white vinegar and alcohol in a bottle.  Put 3 or 4 drops into each ear after swimming. SEEK MEDICAL CARE IF:   You have a fever.  Your ear is still red, swollen, painful, or draining pus after 3 days.  Your redness, swelling, or pain gets worse.  You have a severe headache.  You have redness, swelling, pain, or tenderness in the area behind your ear. MAKE SURE YOU:   Understand these instructions.  Will watch your condition.  Will get help right away if you are not doing well or get worse. Document Released: 05/29/2005 Document Revised: 10/13/2013 Document Reviewed: 06/15/2011 ExitCare Patient Information 2015 [employer]. This information is not intended to replace advice given to you by your health care provider. Make sure you discuss any questions you have with your health care provider. Cerumen Impaction A cerumen impaction is when the wax in your ear forms a plug. This plug usually causes reduced hearing. Sometimes it also causes an earache or dizziness. Removing a cerumen impaction can be difficult and painful. The wax sticks to the ear canal. The canal is sensitive and bleeds easily. If you try to remove a heavy wax buildup with a cotton tipped swab, you may push it in further. Irrigation with water, suction, and small ear curettes may be used to clear out the wax. If the impaction is fixed to the skin in the ear canal, ear drops may be needed for a few days to loosen the wax. People who build up a lot of wax frequently can use ear wax   removal products available in your local drugstore. SEEK MEDICAL CARE IF:  You develop an earache, increased hearing loss, or marked dizziness. Document Released: 07/06/2004 Document Revised: 08/21/2011 Document Reviewed: 08/26/2009 ExitCare Patient Information 2015 [employer]. This information is not intended to replace advice given to you by your health care provider. Make sure you discuss any questions you have with your health care provider.  

## 2016-08-04 LAB — PREGNANCY, URINE: Preg Test, Ur: POSITIVE

## 2016-08-16 ENCOUNTER — Encounter: Payer: Self-pay | Admitting: *Deleted

## 2016-08-24 ENCOUNTER — Ambulatory Visit (INDEPENDENT_AMBULATORY_CARE_PROVIDER_SITE_OTHER): Payer: Medicaid Other | Admitting: Medical

## 2016-08-24 ENCOUNTER — Other Ambulatory Visit (HOSPITAL_COMMUNITY)
Admission: RE | Admit: 2016-08-24 | Discharge: 2016-08-24 | Disposition: A | Discharge status: Home / Self Care | Payer: Medicaid Other | Source: Ambulatory Visit | Attending: Medical | Admitting: Medical

## 2016-08-24 ENCOUNTER — Encounter: Payer: Self-pay | Admitting: Medical

## 2016-08-24 ENCOUNTER — Ambulatory Visit: Payer: Self-pay

## 2016-08-24 VITALS — BP 121/72 | HR 87 | Wt 163.6 lb

## 2016-08-24 DIAGNOSIS — F1120 Opioid dependence, uncomplicated: Secondary | ICD-10-CM | POA: Diagnosis not present

## 2016-08-24 DIAGNOSIS — Z113 Encounter for screening for infections with a predominantly sexual mode of transmission: Secondary | ICD-10-CM | POA: Insufficient documentation

## 2016-08-24 DIAGNOSIS — O3680X0 Pregnancy with inconclusive fetal viability, not applicable or unspecified: Secondary | ICD-10-CM

## 2016-08-24 DIAGNOSIS — Z3481 Encounter for supervision of other normal pregnancy, first trimester: Secondary | ICD-10-CM

## 2016-08-24 DIAGNOSIS — O0992 Supervision of high risk pregnancy, unspecified, second trimester: Secondary | ICD-10-CM | POA: Insufficient documentation

## 2016-08-24 DIAGNOSIS — O99321 Drug use complicating pregnancy, first trimester: Secondary | ICD-10-CM

## 2016-08-24 DIAGNOSIS — O99320 Drug use complicating pregnancy, unspecified trimester: Secondary | ICD-10-CM

## 2016-08-24 DIAGNOSIS — Z3401 Encounter for supervision of normal first pregnancy, first trimester: Secondary | ICD-10-CM

## 2016-08-24 NOTE — Patient Instructions (Signed)
First Trimester of Pregnancy The first trimester of pregnancy is from week 1 until the end of week 13 (months 1 through 3). During this time, your baby will begin to develop inside you. At 6-8 weeks, the eyes and face are formed, and the heartbeat can be seen on ultrasound. At the end of 12 weeks, all the baby's organs are formed. Prenatal care is all the medical care you receive before the birth of your baby. Make sure you get good prenatal care and follow all of your doctor's instructions. Follow these instructions at home: Medicines  Take over-the-counter and prescription medicines only as told by your doctor. Some medicines are safe and some medicines are not safe during pregnancy.  Take a prenatal vitamin that contains at least 600 micrograms (mcg) of folic acid.  If you have trouble pooping (constipation), take medicine that will make your stool soft (stool softener) if your doctor approves. Eating and drinking  Eat regular, healthy meals.  Your doctor will tell you the amount of weight gain that is right for you.  Avoid raw meat and uncooked cheese.  If you feel sick to your stomach (nauseous) or throw up (vomit): ? Eat 4 or 5 small meals a day instead of 3 large meals. ? Try eating a few soda crackers. ? Drink liquids between meals instead of during meals.  To prevent constipation: ? Eat foods that are high in fiber, like fresh fruits and vegetables, whole grains, and beans. ? Drink enough fluids to keep your pee (urine) clear or pale yellow. Activity  Exercise only as told by your doctor. Stop exercising if you have cramps or pain in your lower belly (abdomen) or low back.  Do not exercise if it is too hot, too humid, or if you are in a place of great height (high altitude).  Try to avoid standing for long periods of time. Move your legs often if you must stand in one place for a long time.  Avoid heavy lifting.  Wear low-heeled shoes. Sit and stand up straight.  You  can have sex unless your doctor tells you not to. Relieving pain and discomfort  Wear a good support bra if your breasts are sore.  Take warm water baths (sitz baths) to soothe pain or discomfort caused by hemorrhoids. Use hemorrhoid cream if your doctor says it is okay.  Rest with your legs raised if you have leg cramps or low back pain.  If you have puffy, bulging veins (varicose veins) in your legs: ? Wear support hose or compression stockings as told by your doctor. ? Raise (elevate) your feet for 15 minutes, 3-4 times a day. ? Limit salt in your food. Prenatal care  Schedule your prenatal visits by the twelfth week of pregnancy.  Write down your questions. Take them to your prenatal visits.  Keep all your prenatal visits as told by your doctor. This is important. Safety  Wear your seat belt at all times when driving.  Make a list of emergency phone numbers. The list should include numbers for family, friends, the hospital, and police and fire departments. General instructions  Ask your doctor for a referral to a local prenatal class. Begin classes no later than at the start of month 6 of your pregnancy.  Ask for help if you need counseling or if you need help with nutrition. Your doctor can give you advice or tell you where to go for help.  Do not use hot tubs, steam rooms, or   saunas.  Do not douche or use tampons or scented sanitary pads.  Do not cross your legs for long periods of time.  Avoid all herbs and alcohol. Avoid drugs that are not approved by your doctor.  Do not use any tobacco products, including cigarettes, chewing tobacco, and electronic cigarettes. If you need help quitting, ask your doctor. You may get counseling or other support to help you quit.  Avoid cat litter boxes and soil used by cats. These carry germs that can cause birth defects in the baby and can cause a loss of your baby (miscarriage) or stillbirth.  Visit your dentist. At home, brush  your teeth with a soft toothbrush. Be gentle when you floss. Contact a doctor if:  You are dizzy.  You have mild cramps or pressure in your lower belly.  You have a nagging pain in your belly area.  You continue to feel sick to your stomach, you throw up, or you have watery poop (diarrhea).  You have a bad smelling fluid coming from your vagina.  You have pain when you pee (urinate).  You have increased puffiness (swelling) in your face, hands, legs, or ankles. Get help right away if:  You have a fever.  You are leaking fluid from your vagina.  You have spotting or bleeding from your vagina.  You have very bad belly cramping or pain.  You gain or lose weight rapidly.  You throw up blood. It may look like coffee grounds.  You are around people who have German measles, fifth disease, or chickenpox.  You have a very bad headache.  You have shortness of breath.  You have any kind of trauma, such as from a fall or a car accident. Summary  The first trimester of pregnancy is from week 1 until the end of week 13 (months 1 through 3).  To take care of yourself and your unborn baby, you will need to eat healthy meals, take medicines only if your doctor tells you to do so, and do activities that are safe for you and your baby.  Keep all follow-up visits as told by your doctor. This is important as your doctor will have to ensure that your baby is healthy and growing well. This information is not intended to replace advice given to you by your health care provider. Make sure you discuss any questions you have with your health care provider. Document Released: 11/15/2007 Document Revised: 06/06/2016 Document Reviewed: 06/06/2016 Elsevier Interactive Patient Education  2017 [employer]  

## 2016-08-24 NOTE — Progress Notes (Signed)
   PRENATAL VISIT NOTE  Subjective:  Jessica Huffman is a 26 y.o. G2P0010 at 3259w3d being seen today for her initial prenatal care visit.  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  Patient reports no complaints.   . Vag. Bleeding: None.   . Denies leaking of fluid.   The following portions of the patient's history were reviewed and updated as appropriate: allergies, current medications, past family history, past medical history, past social history, past surgical history and problem list. Problem list updated.  Objective:   Vitals:   08/24/16 1000  BP: 121/72  Pulse: 87  Weight: 163 lb 9.6 oz (74.2 kg)    Fetal Status: Fetal Heart Rate (bpm): 154         General:  Alert, oriented and cooperative. Patient is in no acute distress.  Skin: Skin is warm and dry. No rash noted.   Cardiovascular: Normal heart rate noted  Respiratory: Normal respiratory effort, no problems with respiration noted  Abdomen: Soft, gravid, appropriate for gestational age. Pain/Pressure: Absent     Pelvic:  Cervical exam deferred        Extremities: Normal range of motion.  Edema: None  Mental Status: Normal mood and affect. Normal behavior. Normal judgment and thought content.   Assessment and Plan:  Pregnancy: G2P0010 at 4259w3d  1. Encounter to determine fetal viability of pregnancy, single or unspecified fetus - US OB Limited; scheduled   2. Supervision of low-risk first pregnancy, first trimester - US MFM Fetal Nuchal Translucency; scheduled - Obstetric Panel, Including HIV - Cervicovaginal ancillary only - Culture, OB Urine  3. 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 Please refer to After Visit Summary for other counseling recommendations.  Return in about 4 weeks (around 09/21/2016) for LOB.   Marny LowensteinJulie N Hanan Mcwilliams, PA-C

## 2016-08-24 NOTE — Progress Notes (Signed)
Pt informed that the ultrasound is considered a limited OB ultrasound and is not intended to be a complete ultrasound exam.  Patient also informed that the ultrasound is not being completed with the intent of assessing for fetal or placental anomalies or any pelvic abnormalities.  Explained that the purpose of today's ultrasound is to assess for viability.  Patient acknowledges the purpose of the exam and the limitations of the study.   Single IUP FHR - 158 bpm per M-mode CRL - 3.98 cm (10w 6d)

## 2016-08-24 NOTE — Progress Notes (Signed)
Initial prenatal education packet given Breastfeeding education done Declines flu Wants first trimester screen Initial prenatal labs ROI for pap results (Dr Ellyn HackBovard)

## 2016-08-25 LAB — OBSTETRIC PANEL, INCLUDING HIV
ANTIBODY SCREEN: NEGATIVE
BASOS ABS: 0 10*3/uL (ref 0.0–0.2)
BASOS: 0 %
EOS (ABSOLUTE): 0.2 10*3/uL (ref 0.0–0.4)
Eos: 3 %
HEMATOCRIT: 38.4 % (ref 34.0–46.6)
HEP B S AG: NEGATIVE
HIV Screen 4th Generation wRfx: NONREACTIVE
Hemoglobin: 12.5 g/dL (ref 11.1–15.9)
IMMATURE GRANS (ABS): 0 10*3/uL (ref 0.0–0.1)
Immature Granulocytes: 0 %
LYMPHS: 35 %
Lymphocytes Absolute: 1.9 10*3/uL (ref 0.7–3.1)
MCH: 28.2 pg (ref 26.6–33.0)
MCHC: 32.6 g/dL (ref 31.5–35.7)
MCV: 87 fL (ref 79–97)
MONOCYTES: 7 %
Monocytes Absolute: 0.4 10*3/uL (ref 0.1–0.9)
Neutrophils Absolute: 3 10*3/uL (ref 1.4–7.0)
Neutrophils: 55 %
Platelets: 313 10*3/uL (ref 150–379)
RBC: 4.43 x10E6/uL (ref 3.77–5.28)
RDW: 14.6 % (ref 12.3–15.4)
RPR Ser Ql: NONREACTIVE
RUBELLA: 2.7 {index} (ref >0.99)
Rh Factor: POSITIVE
WBC: 5.5 10*3/uL (ref 3.4–10.8)

## 2016-08-25 LAB — POCT URINALYSIS DIP (DEVICE)
Bilirubin Urine: NEGATIVE
GLUCOSE, UA: NEGATIVE mg/dL
Hgb urine dipstick: NEGATIVE
KETONES UR: NEGATIVE mg/dL
LEUKOCYTES UA: NEGATIVE
Nitrite: NEGATIVE
Protein, ur: NEGATIVE mg/dL
SPECIFIC GRAVITY, URINE: 1.015 (ref 1.005–1.030)
Urobilinogen, UA: 0.2 mg/dL (ref 0.0–1.0)
pH: 6.5 (ref 5.0–8.0)

## 2016-08-26 LAB — CERVICOVAGINAL ANCILLARY ONLY
CHLAMYDIA, DNA PROBE: NEGATIVE
NEISSERIA GONORRHEA: NEGATIVE

## 2016-08-27 LAB — URINE CULTURE, OB REFLEX

## 2016-08-27 LAB — CULTURE, OB URINE

## 2016-08-28 ENCOUNTER — Other Ambulatory Visit: Payer: Self-pay | Admitting: Medical

## 2016-08-28 ENCOUNTER — Encounter: Payer: Self-pay | Admitting: General Practice

## 2016-08-28 DIAGNOSIS — B952 Enterococcus as the cause of diseases classified elsewhere: Secondary | ICD-10-CM

## 2016-08-28 DIAGNOSIS — N390 Urinary tract infection, site not specified: Principal | ICD-10-CM

## 2016-08-28 MED ORDER — CEPHALEXIN 500 MG PO CAPS: 500.0000 mg | ORAL_CAPSULE | Freq: Four times a day (QID) | ORAL | 0 refills | Status: DC

## 2016-09-02 ENCOUNTER — Emergency Department (HOSPITAL_COMMUNITY)
Admission: EM | Admit: 2016-09-02 | Discharge: 2016-09-02 | Disposition: A | Discharge status: Home / Self Care | Payer: Medicaid Other | Attending: Emergency Medicine | Admitting: Emergency Medicine

## 2016-09-02 ENCOUNTER — Emergency Department (HOSPITAL_COMMUNITY): Payer: Medicaid Other

## 2016-09-02 ENCOUNTER — Encounter (HOSPITAL_COMMUNITY): Payer: Self-pay | Admitting: Radiology

## 2016-09-02 DIAGNOSIS — Y999 Unspecified external cause status: Secondary | ICD-10-CM | POA: Insufficient documentation

## 2016-09-02 DIAGNOSIS — Z3A12 12 weeks gestation of pregnancy: Secondary | ICD-10-CM | POA: Insufficient documentation

## 2016-09-02 DIAGNOSIS — O99331 Smoking (tobacco) complicating pregnancy, first trimester: Secondary | ICD-10-CM | POA: Insufficient documentation

## 2016-09-02 DIAGNOSIS — O9A211 Injury, poisoning and certain other consequences of external causes complicating pregnancy, first trimester: Secondary | ICD-10-CM | POA: Insufficient documentation

## 2016-09-02 DIAGNOSIS — M545 Low back pain: Secondary | ICD-10-CM | POA: Insufficient documentation

## 2016-09-02 DIAGNOSIS — F17200 Nicotine dependence, unspecified, uncomplicated: Secondary | ICD-10-CM | POA: Diagnosis not present

## 2016-09-02 DIAGNOSIS — Y939 Activity, unspecified: Secondary | ICD-10-CM | POA: Diagnosis not present

## 2016-09-02 DIAGNOSIS — Y92410 Unspecified street and highway as the place of occurrence of the external cause: Secondary | ICD-10-CM | POA: Diagnosis not present

## 2016-09-02 MED ORDER — SODIUM CHLORIDE 0.9 % IV BOLUS (SEPSIS)
1000.0000 mL | Freq: Once | INTRAVENOUS | Status: AC
  Administered 2016-09-02: 1000 mL via INTRAVENOUS

## 2016-09-02 NOTE — ED Notes (Signed)
The pts parents are here at the bedside
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
## 2016-09-02 NOTE — ED Notes (Signed)
Portable chest x-ray

## 2016-09-02 NOTE — ED Notes (Signed)
Ultra sound of abd by the ed res  She is unable to count a heart beat 12- weeks pregnant
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
## 2016-09-02 NOTE — ED Notes (Signed)
Pt resting and relaxing
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
## 2016-09-02 NOTE — ED Notes (Signed)
Up walking to the br  Pain still the same
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
## 2016-09-02 NOTE — ED Notes (Signed)
The pt s hands washed with soap and water no other cuts or abrasions noted

## 2016-09-02 NOTE — Discharge Instructions (Signed)
Tylenol for pain

## 2016-09-03 ENCOUNTER — Encounter (HOSPITAL_COMMUNITY): Payer: Self-pay

## 2016-09-03 ENCOUNTER — Encounter: Payer: Self-pay | Admitting: Medical

## 2016-09-05 ENCOUNTER — Telehealth: Payer: Self-pay | Admitting: General Practice
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
## 2016-09-14 ENCOUNTER — Encounter (HOSPITAL_COMMUNITY): Payer: Self-pay

## 2016-09-15 ENCOUNTER — Other Ambulatory Visit: Payer: Self-pay | Admitting: Medical

## 2016-09-15 ENCOUNTER — Encounter (HOSPITAL_COMMUNITY): Payer: Self-pay

## 2016-09-15 ENCOUNTER — Ambulatory Visit (HOSPITAL_COMMUNITY)
Admission: RE | Admit: 2016-09-15 | Discharge: 2016-09-15 | Disposition: A | Discharge status: Home / Self Care | Payer: Medicaid Other | Source: Ambulatory Visit | Attending: Medical | Admitting: Medical

## 2016-09-15 ENCOUNTER — Ambulatory Visit (HOSPITAL_COMMUNITY): Admission: RE | Admit: 2016-09-15 | Payer: Medicaid Other | Source: Ambulatory Visit

## 2016-09-15 DIAGNOSIS — Z3401 Encounter for supervision of normal first pregnancy, first trimester: Secondary | ICD-10-CM

## 2016-09-15 DIAGNOSIS — O99322 Drug use complicating pregnancy, second trimester: Secondary | ICD-10-CM | POA: Insufficient documentation

## 2016-09-15 DIAGNOSIS — Z3A14 14 weeks gestation of pregnancy: Secondary | ICD-10-CM

## 2016-09-21 ENCOUNTER — Ambulatory Visit (INDEPENDENT_AMBULATORY_CARE_PROVIDER_SITE_OTHER): Payer: Medicaid Other | Admitting: Medical

## 2016-09-21 VITALS — BP 124/67 | HR 87 | Wt 171.0 lb

## 2016-09-21 DIAGNOSIS — Z3401 Encounter for supervision of normal first pregnancy, first trimester: Secondary | ICD-10-CM | POA: Diagnosis not present

## 2016-09-21 DIAGNOSIS — F1120 Opioid dependence, uncomplicated: Secondary | ICD-10-CM | POA: Diagnosis not present

## 2016-09-21 DIAGNOSIS — O99321 Drug use complicating pregnancy, first trimester: Secondary | ICD-10-CM | POA: Diagnosis not present

## 2016-09-21 NOTE — Progress Notes (Signed)
   PRENATAL VISIT NOTE  Subjective:  Jessica Huffman is a 26 y.o. G2P0010 at [redacted]w[redacted]d being seen today for ongoing prenatal care.  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  Patient reports no complaints.   . Vag. Bleeding: None.  Movement: Absent. Denies leaking of fluid.   The following portions of the patient's history were reviewed and updated as appropriate: allergies, current medications, past family history, past medical history, past social history, past surgical history and problem list. Problem list updated.  Objective:   Vitals:   09/21/16 0946  BP: 124/67  Pulse: 87  Weight: 171 lb (77.6 kg)    Fetal Status: Fetal Heart Rate (bpm): 140   Movement: Absent     General:  Alert, oriented and cooperative. Patient is in no acute distress.  Skin: Skin is warm and dry. No rash noted.   Cardiovascular: Normal heart rate noted  Respiratory: Normal respiratory effort, no problems with respiration noted  Abdomen: Soft, gravid, appropriate for gestational age. Pain/Pressure: Absent     Pelvic:  Cervical exam deferred        Extremities: Normal range of motion.     Mental Status: Normal mood and affect. Normal behavior. Normal judgment and thought content.   Assessment and Plan:  Pregnancy: G2P0010 at [redacted]w[redacted]d  1. 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 No outbreaks since then. Will draw HSV 2 IGG at next visit to confirm.  - Discussed 1st screen was not completed due to advanced GA, discussed quad screen today, but patient is too early, will draw at next visit - Discussed breastfeeding and prenatal classes as well waterbirth, briefly. Patient will  look into classes and will need to see CNM if considering waterbirth  2. 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 Please refer to After Visit Summary for other counseling recommendations.  Return in about 4 weeks (around 10/19/2016) for LOB.   Marny Lowenstein, PA-C

## 2016-09-21 NOTE — Patient Instructions (Signed)
Second Trimester of Pregnancy The second trimester is from week 13 through week 28, month 4 through 6. This is often the time in pregnancy that you feel your best. Often times, morning sickness has lessened or quit. You may have more energy, and you may get hungry more often. Your unborn baby (fetus) is growing rapidly. At the end of the sixth month, he or she is about 9 inches long and weighs about 1 pounds. You will likely feel the baby move (quickening) between 18 and 20 weeks of pregnancy. Follow these instructions at home:  Avoid all smoking, herbs, and alcohol. Avoid drugs not approved by your doctor.  Do not use any tobacco products, including cigarettes, chewing tobacco, and electronic cigarettes. If you need help quitting, ask your doctor. You may get counseling or other support to help you quit.  Only take medicine as told by your doctor. Some medicines are safe and some are not during pregnancy.  Exercise only as told by your doctor. Stop exercising if you start having cramps.  Eat regular, healthy meals.  Wear a good support bra if your breasts are tender.  Do not use hot tubs, steam rooms, or saunas.  Wear your seat belt when driving.  Avoid raw meat, uncooked cheese, and liter boxes and soil used by cats.  Take your prenatal vitamins.  Take 1500-2000 milligrams of calcium daily starting at the 20th week of pregnancy until you deliver your baby.  Try taking medicine that helps you poop (stool softener) as needed, and if your doctor approves. Eat more fiber by eating fresh fruit, vegetables, and whole grains. Drink enough fluids to keep your pee (urine) clear or pale yellow.  Take warm water baths (sitz baths) to soothe pain or discomfort caused by hemorrhoids. Use hemorrhoid cream if your doctor approves.  If you have puffy, bulging veins (varicose veins), wear support hose. Raise (elevate) your feet for 15 minutes, 3-4 times a day. Limit salt in your diet.  Avoid heavy  lifting, wear low heals, and sit up straight.  Rest with your legs raised if you have leg cramps or low back pain.  Visit your dentist if you have not gone during your pregnancy. Use a soft toothbrush to brush your teeth. Be gentle when you floss.  You can have sex (intercourse) unless your doctor tells you not to.  Go to your doctor visits. Get help if:  You feel dizzy.  You have mild cramps or pressure in your lower belly (abdomen).  You have a nagging pain in your belly area.  You continue to feel sick to your stomach (nauseous), throw up (vomit), or have watery poop (diarrhea).  You have bad smelling fluid coming from your vagina.  You have pain with peeing (urination). Get help right away if:  You have a fever.  You are leaking fluid from your vagina.  You have spotting or bleeding from your vagina.  You have severe belly cramping or pain.  You lose or gain weight rapidly.  You have trouble catching your breath and have chest pain.  You notice sudden or extreme puffiness (swelling) of your face, hands, ankles, feet, or legs.  You have not felt the baby move in over an hour.  You have severe headaches that do not go away with medicine.  You have vision changes. This information is not intended to replace advice given to you by your health care provider. Make sure you discuss any questions you have with your health care   provider. Document Released: 08/23/2009 Document Revised: 11/04/2015 Document Reviewed: 07/30/2012 Elsevier Interactive Patient Education  2017 Elsevier Inc.  

## 2016-10-23 ENCOUNTER — Ambulatory Visit (HOSPITAL_COMMUNITY)
Admission: RE | Admit: 2016-10-23 | Discharge: 2016-10-23 | Disposition: A | Discharge status: Home / Self Care | Payer: Medicaid Other | Source: Ambulatory Visit | Attending: Medical | Admitting: Medical

## 2016-10-23 ENCOUNTER — Other Ambulatory Visit: Payer: Self-pay | Admitting: Medical

## 2016-10-23 DIAGNOSIS — Z363 Encounter for antenatal screening for malformations: Secondary | ICD-10-CM

## 2016-10-23 DIAGNOSIS — O99322 Drug use complicating pregnancy, second trimester: Secondary | ICD-10-CM | POA: Diagnosis not present

## 2016-10-23 DIAGNOSIS — O99332 Smoking (tobacco) complicating pregnancy, second trimester: Secondary | ICD-10-CM | POA: Insufficient documentation

## 2016-10-23 DIAGNOSIS — Z3A19 19 weeks gestation of pregnancy: Secondary | ICD-10-CM | POA: Diagnosis not present

## 2016-10-23 DIAGNOSIS — Z3401 Encounter for supervision of normal first pregnancy, first trimester: Secondary | ICD-10-CM

## 2016-10-24 ENCOUNTER — Ambulatory Visit (INDEPENDENT_AMBULATORY_CARE_PROVIDER_SITE_OTHER): Payer: Medicaid Other | Admitting: Advanced Practice Midwife

## 2016-10-24 DIAGNOSIS — Z3482 Encounter for supervision of other normal pregnancy, second trimester: Secondary | ICD-10-CM

## 2016-10-24 DIAGNOSIS — Z3401 Encounter for supervision of normal first pregnancy, first trimester: Secondary | ICD-10-CM

## 2016-10-24 NOTE — Patient Instructions (Signed)

## 2016-10-24 NOTE — Progress Notes (Signed)
   PRENATAL VISIT NOTE  Subjective:  Jessica Huffman is a 26 y.o. G2P0010 at 5089w4d being seen today for ongoing prenatal care.  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  Patient reports no complaints.  Contractions: Not present. Vag. Bleeding: None.  Movement: Present. Denies leaking of fluid.   The following portions of the patient's history were reviewed and updated as appropriate: allergies, current medications, past family history, past medical history, past social history, past surgical history and problem list. Problem list updated.  Asking if she can have a waterbirth.   Objective:   Vitals:   10/24/16 1131  BP: 125/74  Pulse: 78  Weight: 178 lb 14.4 oz (81.1 kg)    Fetal Status: Fetal Heart Rate (bpm): 140 Fundal Height: 21 cm Movement: Present     General:  Alert, oriented and cooperative. Patient is in no acute distress.  Skin: Skin is warm and dry. No rash noted.   Cardiovascular: Normal heart rate noted  Respiratory: Normal respiratory effort, no problems with respiration noted  Abdomen: Soft, gravid, appropriate for gestational age. Pain/Pressure: Absent     Pelvic:  Cervical exam deferred        Extremities: Normal range of motion.  Edema: None  Mental Status: Normal mood and affect. Normal behavior. Normal judgment and thought content.   Assessment and Plan:  Pregnancy: G2P0010 at 4089w4d  1. Supervision of high-risk first pregnancy, first trimester - Will discuss w/ fellow providers to determine if pt is candidate for waterbirth.   - refused Quad  Preterm labor symptoms and general obstetric precautions including but not limited to vaginal bleeding, contractions, leaking of fluid and fetal movement were reviewed in detail with the patient. Please refer to After  Visit Summary for other counseling recommendations.  Return in about 4 weeks (around 11/21/2016) for ROB.   Katrinka BlazingSmith, [place], CNM

## 2016-11-22 ENCOUNTER — Encounter: Payer: Self-pay | Admitting: Obstetrics and Gynecology

## 2016-11-22 ENCOUNTER — Ambulatory Visit (INDEPENDENT_AMBULATORY_CARE_PROVIDER_SITE_OTHER): Payer: Medicaid Other | Admitting: Obstetrics and Gynecology

## 2016-11-22 VITALS — BP 118/77 | HR 96 | Wt 188.3 lb

## 2016-11-22 DIAGNOSIS — O99321 Drug use complicating pregnancy, first trimester: Secondary | ICD-10-CM | POA: Diagnosis not present

## 2016-11-22 DIAGNOSIS — F1120 Opioid dependence, uncomplicated: Secondary | ICD-10-CM | POA: Diagnosis not present

## 2016-11-22 DIAGNOSIS — O99322 Drug use complicating pregnancy, second trimester: Secondary | ICD-10-CM | POA: Diagnosis not present

## 2016-11-22 DIAGNOSIS — O0992 Supervision of high risk pregnancy, unspecified, second trimester: Secondary | ICD-10-CM | POA: Diagnosis not present

## 2016-11-22 NOTE — Progress Notes (Signed)
Prenatal Visit Note Date: 11/22/2016 Clinic: [facility]  Subjective:  Jessica Huffman is a 26 y.o. G2P0010 at 8762w5d being seen today for ongoing prenatal care.  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  Patient reports no complaints.    . Vag. Bleeding: None.  Movement: Present. Denies leaking of fluid.   The following portions of the patient's history were reviewed and updated as appropriate: allergies, current medications, past family history, past medical history, past social history, past surgical history and problem list. Problem list updated.  Objective:   Vitals:   11/22/16 1043  BP: 118/77  Pulse: 96  Weight: 188 lb 4.8 oz (85.4 kg)    Fetal Status: Fetal Heart Rate (bpm): 143 Fundal Height: 24 cm Movement: Present     General:  Alert, oriented and cooperative. Patient is in no acute distress.  Skin: Skin is warm and dry. No rash noted.   Cardiovascular: Normal heart rate noted  Respiratory: Normal respiratory effort, no problems with respiration noted  Abdomen: Soft, gravid, appropriate for gestational age. Pain/Pressure: Absent     Pelvic:  Cervical exam deferred        Extremities: Normal range of motion.  Edema: None  Mental Status: Normal mood and affect. Normal behavior. Normal judgment and thought content.   Urinalysis:      Assessment and Plan:  Pregnancy: G2P0010 at 4962w5d  1. 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 [place] [employer] D/w her re: NAAS (pt already aware) and she is interested in NICU tour. Will set that up for her. Surveillance growth per mfm recs in a few weeks - US MFM OB FOLLOW UP; Future  2. Supervision of high risk pregnancy, antepartum, second  trimester Routine care. 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  - US MFM OB FOLLOW UP; Future  Preterm labor symptoms and general obstetric precautions including but not limited to vaginal bleeding, contractions, leaking of fluid and fetal movement were reviewed in detail with the patient. Please refer to After Visit Summary for other counseling recommendations.  Return in about 4 weeks (around 12/20/2016) for rob and 2h gtt.   [place] BingPickens, Myisha Pickerel, MD

## 2016-12-14 ENCOUNTER — Other Ambulatory Visit: Payer: Self-pay | Admitting: Obstetrics and Gynecology

## 2016-12-14 ENCOUNTER — Ambulatory Visit (HOSPITAL_COMMUNITY)
Admission: RE | Admit: 2016-12-14 | Discharge: 2016-12-14 | Disposition: A | Discharge status: Home / Self Care | Payer: Medicaid Other | Source: Ambulatory Visit | Attending: Obstetrics and Gynecology | Admitting: Obstetrics and Gynecology

## 2016-12-14 DIAGNOSIS — O321XX0 Maternal care for breech presentation, not applicable or unspecified: Secondary | ICD-10-CM | POA: Insufficient documentation

## 2016-12-14 DIAGNOSIS — O99321 Drug use complicating pregnancy, first trimester: Secondary | ICD-10-CM | POA: Insufficient documentation

## 2016-12-14 DIAGNOSIS — O0992 Supervision of high risk pregnancy, unspecified, second trimester: Secondary | ICD-10-CM | POA: Diagnosis present

## 2016-12-14 DIAGNOSIS — Z3A26 26 weeks gestation of pregnancy: Secondary | ICD-10-CM | POA: Diagnosis not present

## 2016-12-14 DIAGNOSIS — F1120 Opioid dependence, uncomplicated: Secondary | ICD-10-CM

## 2016-12-20 ENCOUNTER — Ambulatory Visit (INDEPENDENT_AMBULATORY_CARE_PROVIDER_SITE_OTHER): Payer: Medicaid Other | Admitting: Advanced Practice Midwife

## 2016-12-20 VITALS — BP 124/70 | HR 82 | Wt 199.0 lb

## 2016-12-20 DIAGNOSIS — O99321 Drug use complicating pregnancy, first trimester: Secondary | ICD-10-CM | POA: Diagnosis not present

## 2016-12-20 DIAGNOSIS — Z23 Encounter for immunization: Secondary | ICD-10-CM | POA: Diagnosis not present

## 2016-12-20 DIAGNOSIS — Z363 Encounter for antenatal screening for malformations: Secondary | ICD-10-CM

## 2016-12-20 DIAGNOSIS — O0992 Supervision of high risk pregnancy, unspecified, second trimester: Secondary | ICD-10-CM | POA: Diagnosis not present

## 2016-12-20 DIAGNOSIS — F1120 Opioid dependence, uncomplicated: Secondary | ICD-10-CM | POA: Diagnosis not present

## 2016-12-20 DIAGNOSIS — Z3A32 32 weeks gestation of pregnancy: Secondary | ICD-10-CM

## 2016-12-20 NOTE — Progress Notes (Signed)
   PRENATAL VISIT NOTE  Subjective:  Jessica Huffman is a 26 y.o. G2P0010 at 3563w5d being seen today for ongoing prenatal care.  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  Patient reports no complaints.  Contractions: Not present. Vag. Bleeding: None.  Movement: Present. Denies leaking of fluid.   The following portions of the patient's history were reviewed and updated as appropriate: allergies, current medications, past family history, past medical history, past social history, past surgical history and problem list. Problem list updated.  Objective:   Vitals:   12/20/16 0836  BP: 124/70  Pulse: 82  Weight: 199 lb (90.3 kg)    Fetal Status: Fetal Heart Rate (bpm): 161 Fundal Height: 29 cm Movement: Present     General:  Alert, oriented and cooperative. Patient is in no acute distress.  Skin: Skin is warm and dry. No rash noted.   Cardiovascular: Normal heart rate noted  Respiratory: Normal respiratory effort, no problems with respiration noted  Abdomen: Soft, gravid, appropriate for gestational age. Pain/Pressure: Absent     Pelvic:  Cervical exam deferred        Extremities: Normal range of motion.  Edema: Trace  Mental Status: Normal mood and affect. Normal behavior. Normal judgment and thought content.   Assessment and Plan:  Pregnancy: G2P0010 at 2763w5d  1. 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 Supervision of high risk pregnancy, antepartum, second trimester  - CBC - RPR - HIV antibody (with reflex) - Glucose Tolerance, 2 Hours w/1 Hour - HSV 2 antibody, IgG -  Hepatitis C Antibody - US MFM OB FOLLOW UP; Future  3. Screening, antenatal, for malformation by ultrasound  - US MFM OB FOLLOW UP; Future  4. [redacted] weeks gestation of pregnancy  - US MFM OB FOLLOW UP; Future  Preterm labor symptoms and general obstetric precautions including but not limited to vaginal bleeding, contractions, leaking of fluid and fetal movement were reviewed in detail with the patient. Please refer to After Visit Summary for other counseling recommendations.  Return in 2 weeks (on 01/03/2017).   Dorathy Kinsman[place]ia Kayo [place], CNM

## 2016-12-20 NOTE — Patient Instructions (Addendum)
AREA PEDIATRIC/FAMILY PRACTICE PHYSICIANS  [facility] [address], [address] [place], [place]  [address] Phone - [phone]   Fax - [phone]  [facility] [address] [place], [place] [address] Phone - [phone]   Fax - [phone]  [facility] AM[facility] [address] [place], [place]  [address] Phone - [phone]   Fax - [phone]  [facility] [address] [place], [place]  [address] Phone - [phone]   Fax - [phone]  [facility] [address] [place], [place]  [address] Phone - [phone]   Fax - [phone]  [facility] [address], [address] [place], [place]  [address] Phone - [phone]   Fax - [phone]  [facility] [address] [place], [place]  [address] Phone - [phone]   Fax - [phone]  [facility] [address], [address] [place], [place]  [address] Phone - [phone]   Fax - [phone]  [facility] [address] [place], [place]  [address] Phone - [phone]   Fax - [phone] [facility] [address] [place], [place]  [address] Phone - [phone]   Fax - [phone]  [facility] [address] [place], [place]  [address] Phone - [phone]   Fax - [phone]  [facility] [address] [place], [place]  [address] Phone - [phone]   Fax - [phone]  [facility] [address], [address] [place], [place]  [address] Phone - [phone]   Fax - [phone]  SHILPA GOSRANI [address] [place], [place]  [address] Phone - [phone]  [facility] [address] [place], [place]  [address] Phone - [phone]   Fax - [phone]  [facility] [address] [place], [place]  [address] Phone - [phone]   Fax - [phone]  [facility] [address] [place], [place]  [address] Phone - [phone]   Fax - [phone]  [facility] [address] [place], [place]  [address] Phone - [phone]   Fax - [phone]   [facility] [address] [place], [place]  [address] Phone - [phone]   Fax - [phone]  [facility] [address] [place], [place]  [address] Phone - [phone]   Fax - [phone]  DAVID RUBIN [address] [place], [place]  [address] Phone - [phone]   Fax - [phone]  [facility] [address] [place], [place]  [address] Phone - [phone]   Fax - [phone]  [facility] [address] , [place]  [address] Phone - [phone]   Fax - [phone] [facility] [address] [place], [place]  [address] Phone - [phone]   Fax - [phone]  [facility] [address] [place], [place]  [address] Phone - [phone]   Fax - [phone]  [facility] [address] [place], [place]  [address] Phone - [phone]   Fax - [phone]  [facility] Charlene Flemming MD [address]   [address] Phone [phone]  Fax [phone]   Third Trimester of Pregnancy The third trimester is from week 28 through week 40 (  months 7 through 9). The third trimester is a time when the unborn baby (fetus) is growing rapidly. At the end of the ninth month, the fetus is about 20 inches in length and weighs 6-10 pounds. Body changes during your third trimester Your body will continue to go through many changes during pregnancy. The changes vary from woman to woman. During the third trimester:  Your weight will continue to increase.  You can expect to gain 25-35 pounds (11-16 kg) by the end of the pregnancy.  You may begin to get stretch marks on your hips, abdomen, and breasts.  You may urinate more often because the fetus is moving lower into your pelvis and pressing on your bladder.  You may develop or continue to have heartburn. This is caused by increased hormones that slow down muscles in the digestive tract.  You may develop or continue to have constipation because increased hormones slow digestion and cause the muscles that push waste through your intestines to relax.  You may develop hemorrhoids. These are swollen veins (varicose veins) in the rectum that can itch or be painful.  You may develop swollen, bulging veins (varicose veins) in your legs.  You may have increased body aches in the pelvis, back, or thighs. This is due to weight gain and increased hormones that are relaxing your joints.  You may have changes in your hair. These can include thickening of your hair, rapid growth, and changes in texture. Some women also have hair loss during or after pregnancy, or hair that feels dry or thin. Your hair will most likely return to normal after your baby is born.  Your breasts will continue to grow and they will continue to become tender. A yellow fluid (colostrum) may leak from your breasts. This is the first milk you are producing for your baby.  Your belly button may stick out.  You may notice more swelling in your hands, face, or ankles.  You may have increased tingling or numbness in your hands, arms, and legs. The skin on your belly may also feel numb.  You may feel short of breath because of your expanding uterus.  You may have more problems sleeping. This can be caused by the size of your belly, increased need to urinate, and an increase in your body's metabolism.  You may notice the fetus "dropping," or moving lower in your abdomen (lightening).  You may have increased vaginal discharge.  You  may notice your joints feel loose and you may have pain around your pelvic bone.  What to expect at prenatal visits You will have prenatal exams every 2 weeks until week 36. Then you will have weekly prenatal exams. During a routine prenatal visit:  You will be weighed to make sure you and the baby are growing normally.  Your blood pressure will be taken.  Your abdomen will be measured to track your baby's growth.  The fetal heartbeat will be listened to.  Any test results from the previous visit will be discussed.  You may have a cervical check near your due date to see if your cervix has softened or thinned (effaced).  You will be tested for Group B streptococcus. This happens between 35 and 37 weeks.  Your health care provider may ask you:  What your birth plan is.  How you are feeling.  If you are feeling the baby move.  If you have had any abnormal symptoms, such as leaking fluid, bleeding, severe headaches, or abdominal cramping.    cramping.  If you are using any tobacco products, including cigarettes, chewing tobacco, and electronic cigarettes.  If you have any questions.  Other tests or screenings that may be performed during your third trimester include:  Blood tests that check for low iron levels (anemia).  Fetal testing to check the health, activity level, and growth of the fetus. Testing is done if you have certain medical conditions or if there are problems during the pregnancy.  Nonstress test (NST). This test checks the health of your baby to make sure there are no signs of problems, such as the baby not getting enough oxygen. During this test, a belt is placed around your belly. The baby is made to move, and its heart rate is monitored during movement.  What is false labor? False labor is a condition in which you feel small, irregular tightenings of the muscles in the womb (contractions) that usually go away with rest, changing position, or drinking  water. These are called Braxton Hicks contractions. Contractions may last for hours, days, or even weeks before true labor sets in. If contractions come at regular intervals, become more frequent, increase in intensity, or become painful, you should see your health care provider. What are the signs of labor?  Abdominal cramps.  Regular contractions that start at 10 minutes apart and become stronger and more frequent with time.  Contractions that start on the top of the uterus and spread down to the lower abdomen and back.  Increased pelvic pressure and dull back pain.  A watery or bloody mucus discharge that comes from the vagina.  Leaking of amniotic fluid. This is also known as your "water breaking." It could be a slow trickle or a gush. Let your health care provider know if it has a color or strange odor. If you have any of these signs, call your health care provider right away, even if it is before your due date. Follow these instructions at home: Medicines  Follow your health care provider's instructions regarding medicine use. Specific medicines may be either safe or unsafe to take during pregnancy.  Take a prenatal vitamin that contains at least 600 micrograms (mcg) of folic acid.  If you develop constipation, try taking a stool softener if your health care provider approves. Eating and drinking  Eat a balanced diet that includes fresh fruits and vegetables, whole grains, good sources of protein such as meat, eggs, or tofu, and low-fat dairy. Your health care provider will help you determine the amount of weight gain that is right for you.  Avoid raw meat and uncooked cheese. These carry germs that can cause birth defects in the baby.  If you have low calcium intake from food, talk to your health care provider about whether you should take a daily calcium supplement.  Eat four or five small meals rather than three large meals a day.  Limit foods that are high in fat and  processed sugars, such as fried and sweet foods.  To prevent constipation: ? Drink enough fluid to keep your urine clear or pale yellow. ? Eat foods that are high in fiber, such as fresh fruits and vegetables, whole grains, and beans. Activity  Exercise only as directed by your health care provider. Most women can continue their usual exercise routine during pregnancy. Try to exercise for 30 minutes at least 5 days a week. Stop exercising if you experience uterine contractions.  Avoid heavy lifting.  Do not exercise in extreme heat or humidity, or at  Wear low-heel, comfortable shoes.  Practice good posture.  You may continue to have sex unless your health care provider tells you otherwise. Relieving pain and discomfort  Take frequent breaks and rest with your legs elevated if you have leg cramps or low back pain.  Take warm sitz baths to soothe any pain or discomfort caused by hemorrhoids. Use hemorrhoid cream if your health care provider approves.  Wear a good support bra to prevent discomfort from breast tenderness.  If you develop varicose veins: ? Wear support pantyhose or compression stockings as told by your healthcare provider. ? Elevate your feet for 15 minutes, 3-4 times a day. Prenatal care  Write down your questions. Take them to your prenatal visits.  Keep all your prenatal visits as told by your health care provider. This is important. Safety  Wear your seat belt at all times when driving.  Make a list of emergency phone numbers, including numbers for family, friends, the hospital, and police and fire departments. General instructions  Avoid cat litter boxes and soil used by cats. These carry germs that can cause birth defects in the baby. If you have a cat, ask someone to clean the litter box for you.  Do not travel far distances unless it is absolutely necessary and only with the approval of your health care provider.  Do not use hot tubs, steam rooms, or  saunas.  Do not drink alcohol.  Do not use any products that contain nicotine or tobacco, such as cigarettes and e-cigarettes. If you need help quitting, ask your health care provider.  Do not use any medicinal herbs or unprescribed drugs. These chemicals affect the formation and growth of the baby.  Do not douche or use tampons or scented sanitary pads.  Do not cross your legs for long periods of time.  To prepare for the arrival of your baby: ? Take prenatal classes to understand, practice, and ask questions about labor and delivery. ? Make a trial run to the hospital. ? Visit the hospital and tour the maternity area. ? Arrange for maternity or paternity leave through employers. ? Arrange for family and friends to take care of pets while you are in the hospital. ? Purchase a rear-facing car seat and make sure you know how to install it in your car. ? Pack your hospital bag. ? Prepare the baby's nursery. Make sure to remove all pillows and stuffed animals from the baby's crib to prevent suffocation.  Visit your dentist if you have not gone during your pregnancy. Use a soft toothbrush to brush your teeth and be gentle when you floss. Contact a health care provider if:  You are unsure if you are in labor or if your water has broken.  You become dizzy.  You have mild pelvic cramps, pelvic pressure, or nagging pain in your abdominal area.  You have lower back pain.  You have persistent nausea, vomiting, or diarrhea.  You have an unusual or bad smelling vaginal discharge.  You have pain when you urinate. Get help right away if:  Your water breaks before 37 weeks.  You have regular contractions less than 5 minutes apart before 37 weeks.  You have a fever.  You are leaking fluid from your vagina.  You have spotting or bleeding from your vagina.  You have severe abdominal pain or cramping.  You have rapid weight loss or weight gain.  You have shortness of breath with  chest pain.  You notice sudden or extreme   sudden or extreme swelling of your face, hands, ankles, feet, or legs.  Your baby makes fewer than 10 movements in 2 hours.  You have severe headaches that do not go away when you take medicine.  You have vision changes. Summary  The third trimester is from week 28 through week 40, months 7 through 9. The third trimester is a time when the unborn baby (fetus) is growing rapidly.  During the third trimester, your discomfort may increase as you and your baby continue to gain weight. You may have abdominal, leg, and back pain, sleeping problems, and an increased need to urinate.  During the third trimester your breasts will keep growing and they will continue to become tender. A yellow fluid (colostrum) may leak from your breasts. This is the first milk you are producing for your baby.  False labor is a condition in which you feel small, irregular tightenings of the muscles in the womb (contractions) that eventually go away. These are called Braxton Hicks contractions. Contractions may last for hours, days, or even weeks before true labor sets in.  Signs of labor can include: abdominal cramps; regular contractions that start at 10 minutes apart and become stronger and more frequent with time; watery or bloody mucus discharge that comes from the vagina; increased pelvic pressure and dull back pain; and leaking of amniotic fluid. This information is not intended to replace advice given to you by your health care provider. Make sure you discuss any questions you have with your health care provider. Document Released: 05/23/2001 Document Revised: 11/04/2015 Document Reviewed: 07/30/2012 Elsevier Interactive Patient Education  2017 [employer]

## 2016-12-21 LAB — CBC
Hematocrit: 39.3 % (ref 34.0–46.6)
Hemoglobin: 12.9 g/dL (ref 11.1–15.9)
MCH: 29.4 pg (ref 26.6–33.0)
MCHC: 32.8 g/dL (ref 31.5–35.7)
MCV: 90 fL (ref 79–97)
PLATELETS: 259 10*3/uL (ref 150–379)
RBC: 4.39 x10E6/uL (ref 3.77–5.28)
RDW: 14.7 % (ref 12.3–15.4)
WBC: 7 10*3/uL (ref 3.4–10.8)

## 2016-12-21 LAB — HSV 2 ANTIBODY, IGG: HSV 2 Glycoprotein G Ab, IgG: <0.91 index (ref 0.00–0.90)

## 2016-12-21 LAB — RPR: RPR: NONREACTIVE

## 2016-12-21 LAB — GLUCOSE TOLERANCE, 2 HOURS W/ 1HR
GLUCOSE, 1 HOUR: 106 mg/dL (ref 65–179)
Glucose, 2 hour: 87 mg/dL (ref 65–152)
Glucose, Fasting: 82 mg/dL (ref 65–91)

## 2016-12-21 LAB — HEPATITIS C ANTIBODY: Hep C Virus Ab: <0.1 s/co ratio (ref 0.0–0.9)

## 2016-12-21 LAB — HIV ANTIBODY (ROUTINE TESTING W REFLEX): HIV Screen 4th Generation wRfx: NONREACTIVE

## 2016-12-22 ENCOUNTER — Encounter: Payer: Self-pay | Admitting: Advanced Practice Midwife

## 2016-12-22 DIAGNOSIS — F1111 Opioid abuse, in remission: Secondary | ICD-10-CM | POA: Insufficient documentation

## 2016-12-22 DIAGNOSIS — N949 Unspecified condition associated with female genital organs and menstrual cycle: Secondary | ICD-10-CM | POA: Insufficient documentation

## 2017-01-08 ENCOUNTER — Ambulatory Visit (INDEPENDENT_AMBULATORY_CARE_PROVIDER_SITE_OTHER): Payer: Medicaid Other | Admitting: Obstetrics and Gynecology

## 2017-01-08 ENCOUNTER — Encounter: Payer: Self-pay | Admitting: Obstetrics and Gynecology

## 2017-01-08 VITALS — BP 124/81 | HR 95 | Wt 204.0 lb

## 2017-01-08 DIAGNOSIS — O99321 Drug use complicating pregnancy, first trimester: Secondary | ICD-10-CM

## 2017-01-08 DIAGNOSIS — O0993 Supervision of high risk pregnancy, unspecified, third trimester: Secondary | ICD-10-CM

## 2017-01-08 DIAGNOSIS — O99323 Drug use complicating pregnancy, third trimester: Secondary | ICD-10-CM

## 2017-01-08 DIAGNOSIS — F1120 Opioid dependence, uncomplicated: Secondary | ICD-10-CM

## 2017-01-08 DIAGNOSIS — O0992 Supervision of high risk pregnancy, unspecified, second trimester: Secondary | ICD-10-CM

## 2017-01-08 NOTE — Progress Notes (Signed)
   PRENATAL VISIT NOTE  Subjective:  Jessica Huffman is a 26 y.o. G2P0010 at 6540w3d being seen today for ongoing prenatal care.  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  Patient reports swelling of BLE, L>R; denies pain.  Contractions: Irregular. Vag. Bleeding: None.  Movement: Present. Denies leaking of fluid.   The following portions of the patient's history were reviewed and updated as appropriate: allergies, current medications, past family history, past medical history, past social history, past surgical history and problem list. Problem list updated.  Objective:   Vitals:   01/08/17 0908  BP: 124/81  Pulse: 95  Weight: 204 lb (92.5 kg)    Fetal Status: Fetal Heart Rate (bpm): 140 Fundal Height: 31 cm Movement: Present     General:  Alert, oriented and cooperative. Patient is in no acute distress.  Skin: Skin is warm and dry. No rash noted.   Cardiovascular: Normal heart rate noted  Respiratory: Normal respiratory effort, no problems with respiration noted  Abdomen: Soft, gravid, appropriate for gestational age.  Pain/Pressure: Absent     Pelvic: Cervical exam deferred        Extremities: Normal range of motion.  Edema: Trace  Mental Status:  Normal mood and affect. Normal behavior. Normal judgment and thought content.   Assessment and Plan:  Pregnancy: G2P0010 at 4040w3d  1. Supervision of high risk pregnancy, antepartum, second trimester  - Discussed waterbirth - still desires - Advised of being a good candidate for waterbirth - Will call for rental of tub supplies  - Plans to hire [employer] officertub manager - Working on birth plan; will bring to next visits - Planning to keep placenta - discussed bringing cooler and contacting  encapsulation providers - Informed that CNMs are on-call 24/7 for waterbirth; even if there is no CNM working on the day she delivers  2. 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 Please refer to After Visit Summary for other counseling recommendations.  Return in about 2 weeks (around 01/22/2017) for Return OB visit.   Raelyn Moraolitta Uzoma Vivona, CNM

## 2017-01-08 NOTE — Patient Instructions (Signed)

## 2017-01-08 NOTE — Progress Notes (Signed)
C/o more edema in left ankle than right, denies pain.

## 2017-01-22 ENCOUNTER — Encounter: Payer: Medicaid Other | Admitting: Advanced Practice Midwife

## 2017-01-24 ENCOUNTER — Encounter: Payer: Self-pay | Admitting: Certified Nurse Midwife

## 2017-01-24 ENCOUNTER — Other Ambulatory Visit: Payer: Self-pay | Admitting: Advanced Practice Midwife

## 2017-01-24 ENCOUNTER — Ambulatory Visit (INDEPENDENT_AMBULATORY_CARE_PROVIDER_SITE_OTHER): Payer: Medicaid Other | Admitting: Certified Nurse Midwife

## 2017-01-24 ENCOUNTER — Ambulatory Visit (HOSPITAL_COMMUNITY)
Admission: RE | Admit: 2017-01-24 | Discharge: 2017-01-24 | Disposition: A | Discharge status: Home / Self Care | Payer: Medicaid Other | Source: Ambulatory Visit | Attending: Advanced Practice Midwife | Admitting: Advanced Practice Midwife

## 2017-01-24 ENCOUNTER — Encounter: Payer: Self-pay | Admitting: Neonatal

## 2017-01-24 VITALS — BP 130/84 | HR 122 | Wt 207.8 lb

## 2017-01-24 DIAGNOSIS — Z362 Encounter for other antenatal screening follow-up: Secondary | ICD-10-CM

## 2017-01-24 DIAGNOSIS — O0992 Supervision of high risk pregnancy, unspecified, second trimester: Secondary | ICD-10-CM

## 2017-01-24 DIAGNOSIS — Z3A32 32 weeks gestation of pregnancy: Secondary | ICD-10-CM

## 2017-01-24 DIAGNOSIS — O99323 Drug use complicating pregnancy, third trimester: Principal | ICD-10-CM

## 2017-01-24 DIAGNOSIS — F1120 Opioid dependence, uncomplicated: Secondary | ICD-10-CM

## 2017-01-24 DIAGNOSIS — Z363 Encounter for antenatal screening for malformations: Secondary | ICD-10-CM | POA: Diagnosis not present

## 2017-01-24 DIAGNOSIS — O99321 Drug use complicating pregnancy, first trimester: Principal | ICD-10-CM

## 2017-01-24 DIAGNOSIS — O0993 Supervision of high risk pregnancy, unspecified, third trimester: Secondary | ICD-10-CM

## 2017-01-24 DIAGNOSIS — F17200 Nicotine dependence, unspecified, uncomplicated: Secondary | ICD-10-CM

## 2017-01-24 DIAGNOSIS — O99333 Smoking (tobacco) complicating pregnancy, third trimester: Secondary | ICD-10-CM

## 2017-01-24 MED ORDER — COMFORT FIT MATERNITY SUPP LG MISC: 1.0000 "application " | Freq: Every day | 0 refills | Status: DC

## 2017-01-24 NOTE — Progress Notes (Signed)
Subjective:  Jessica AsalJami L Huffman is a 26 y.o. G2P0010 at 2268w5d being seen today for ongoing prenatal care.  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  Patient reports no complaints.  Contractions: Irregular. Vag. Bleeding: None.  Movement: Present. Denies leaking of fluid.   The following portions of the patient's history were reviewed and updated as appropriate: allergies, current medications, past family history, past medical history, past social history, past surgical history and problem list. Problem list updated.  Objective:   Vitals:   01/24/17 0945  BP: 130/84  Pulse: (!) 122  Weight: 207 lb 12.8 oz (94.3 kg)    Fetal Status: Fetal Heart Rate (bpm): 140 Fundal Height: 33 cm Movement: Present  Presentation: Vertex  General:  Alert, oriented and cooperative. Patient is in no acute distress.  Skin: Skin is warm and dry. No rash noted.   Cardiovascular: Normal heart rate noted  Respiratory: Normal respiratory effort, no problems with respiration noted  Abdomen: Soft, gravid, appropriate for gestational age. Pain/Pressure: Absent     Pelvic: Vag. Bleeding: None     Cervical exam deferred        Extremities: Normal range of motion.  Edema: Trace  Mental Status: Normal mood and affect. Normal behavior. Normal judgment and thought content.   Urinalysis:      Assessment and Plan:  Pregnancy: G2P0010 at 10068w5d  1. 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 Supervision of high risk pregnancy, antepartum, second trimester - planning waterbirth > need to bring certificate to [facility]  Preterm labor symptoms and general obstetric precautions including but  not limited to vaginal bleeding, contractions, leaking of fluid and fetal movement were reviewed in detail with the patient. Please refer to After Visit Summary for other counseling recommendations.  Return in about 2 weeks (around 02/07/2017).   Donette LarryBhambri, Johnwilliam Shepperson, CNM

## 2017-01-24 NOTE — Progress Notes (Unsigned)
NICU NAS consult and tour completed.

## 2017-01-25 ENCOUNTER — Telehealth: Payer: Self-pay

## 2017-01-25 MED ORDER — COMFORT FIT MATERNITY SUPP LG MISC: 1.0000 "application " | Freq: Every day | 0 refills | Status: DC

## 2017-01-25 NOTE — Telephone Encounter (Signed)
Pt called and stated that she received an Rx for support belt and her pharmacy does not carry it.  Pt wants to know to do? Notified pt that she can come to the front office and pick up a paper Rx and try to take it to a medical supply store.  [facility] and [facility] would have the support belts there as well.  Pt stated that she wanted to try to see if insurance will cover first so she will be here tomorrow, 01/26/17, to pick up Rx.

## 2017-02-07 ENCOUNTER — Encounter: Payer: Self-pay | Admitting: Certified Nurse Midwife

## 2017-02-07 ENCOUNTER — Ambulatory Visit (INDEPENDENT_AMBULATORY_CARE_PROVIDER_SITE_OTHER): Payer: Medicaid Other | Admitting: Certified Nurse Midwife

## 2017-02-07 VITALS — BP 114/66 | HR 76 | Wt 212.3 lb

## 2017-02-07 DIAGNOSIS — O99320 Drug use complicating pregnancy, unspecified trimester: Secondary | ICD-10-CM

## 2017-02-07 DIAGNOSIS — O0992 Supervision of high risk pregnancy, unspecified, second trimester: Secondary | ICD-10-CM

## 2017-02-07 DIAGNOSIS — F1120 Opioid dependence, uncomplicated: Secondary | ICD-10-CM

## 2017-02-07 NOTE — Progress Notes (Signed)
Subjective:  Jessica Huffman is a 26 y.o. G2P0010 at 3035w5d being seen today for ongoing prenatal care.  She is currently monitored for the following issues for this high-risk pregnancy and has Supervision of high risk pregnancy, antepartum, second trimester; Methadone maintenance treatment affecting pregnancy (HCC); History of heroin abuse; and Female genital lesion on her problem list.  Patient reports no complaints.  Contractions: Irregular. Vag. Bleeding: None.  Movement: Present. Denies leaking of fluid.   The following portions of the patient's history were reviewed and updated as appropriate: allergies, current medications, past family history, past medical history, past social history, past surgical history and problem list. Problem list updated.  Objective:   Vitals:   02/07/17 1104  BP: 114/66  Pulse: 76  Weight: 212 lb 4.8 oz (96.3 kg)    Fetal Status: Fetal Heart Rate (bpm): 141 Fundal Height: 35 cm Movement: Present  Presentation: Vertex  General:  Alert, oriented and cooperative. Patient is in no acute distress.  Skin: Skin is warm and dry. No rash noted.   Cardiovascular: Normal heart rate noted  Respiratory: Normal respiratory effort, no problems with respiration noted  Abdomen: Soft, gravid, appropriate for gestational age. Pain/Pressure: Absent     Pelvic: Vag. Bleeding: None     Cervical exam deferred        Extremities: Normal range of motion.  Edema: Trace  Mental Status: Normal mood and affect. Normal behavior. Normal judgment and thought content.   Urinalysis:      Assessment and Plan:  Pregnancy: G2P0010 at 6135w5d  1. Methadone maintenance treatment affecting pregnancy, antepartum (HCC) - nml growth US @32  wks 77%ile  2. Supervision of high risk pregnancy, antepartum, second trimester - reconsidering waterbirth, thinks she may be too hot in tub, may prefer to use shower instead - needs to bring certificate for chart - GBS next visit  Preterm labor symptoms  and general obstetric precautions including but not limited to vaginal bleeding, contractions, leaking of fluid and fetal movement were reviewed in detail with the patient. Please refer to After Visit Summary for other counseling recommendations.  Return in about 2 weeks (around 02/21/2017).   Donette LarryBhambri, Shefali Ng, CNM

## 2017-02-15 ENCOUNTER — Telehealth: Payer: Self-pay

## 2017-02-15 NOTE — Telephone Encounter (Signed)
Patient called because she is experiencing some coughing and congestion. States that she would like to know what OTC medication is safe for her to take.Please return call.

## 2017-02-21 ENCOUNTER — Ambulatory Visit: Payer: Self-pay

## 2017-02-21 ENCOUNTER — Other Ambulatory Visit (HOSPITAL_COMMUNITY)
Admission: RE | Admit: 2017-02-21 | Discharge: 2017-02-21 | Disposition: A | Discharge status: Home / Self Care | Payer: Medicaid Other | Source: Ambulatory Visit | Attending: Advanced Practice Midwife | Admitting: Advanced Practice Midwife

## 2017-02-21 ENCOUNTER — Ambulatory Visit (INDEPENDENT_AMBULATORY_CARE_PROVIDER_SITE_OTHER): Payer: Medicaid Other | Admitting: Advanced Practice Midwife

## 2017-02-21 VITALS — BP 132/83 | HR 86 | Wt 214.8 lb

## 2017-02-21 DIAGNOSIS — F1120 Opioid dependence, uncomplicated: Secondary | ICD-10-CM

## 2017-02-21 DIAGNOSIS — Z113 Encounter for screening for infections with a predominantly sexual mode of transmission: Secondary | ICD-10-CM | POA: Diagnosis not present

## 2017-02-21 DIAGNOSIS — Z331 Pregnant state, incidental: Secondary | ICD-10-CM

## 2017-02-21 DIAGNOSIS — O0992 Supervision of high risk pregnancy, unspecified, second trimester: Secondary | ICD-10-CM

## 2017-02-21 DIAGNOSIS — Z124 Encounter for screening for malignant neoplasm of cervix: Secondary | ICD-10-CM | POA: Diagnosis not present

## 2017-02-21 DIAGNOSIS — O163 Unspecified maternal hypertension, third trimester: Secondary | ICD-10-CM

## 2017-02-21 DIAGNOSIS — O99320 Drug use complicating pregnancy, unspecified trimester: Secondary | ICD-10-CM

## 2017-02-21 DIAGNOSIS — O99322 Drug use complicating pregnancy, second trimester: Secondary | ICD-10-CM

## 2017-02-21 LAB — POCT URINALYSIS DIP (DEVICE)
Bilirubin Urine: NEGATIVE
GLUCOSE, UA: NEGATIVE mg/dL
Hgb urine dipstick: NEGATIVE
Ketones, ur: NEGATIVE mg/dL
LEUKOCYTES UA: NEGATIVE
Nitrite: NEGATIVE
PROTEIN: NEGATIVE mg/dL
Specific Gravity, Urine: 1.025 (ref 1.005–1.030)
UROBILINOGEN UA: 0.2 mg/dL (ref 0.0–1.0)
pH: 6.5 (ref 5.0–8.0)

## 2017-02-21 LAB — OB RESULTS CONSOLE GBS: GBS: NEGATIVE

## 2017-02-21 LAB — OB RESULTS CONSOLE GC/CHLAMYDIA: Gonorrhea: NEGATIVE

## 2017-02-21 NOTE — Progress Notes (Signed)
   PRENATAL VISIT NOTE  Subjective:  Jessica Huffman is a 26 y.o. G2P0010 at 46w5dbeing seen today for ongoing prenatal care.  She is currently monitored for the following issues for this high-risk pregnancy and has Supervision of high risk pregnancy, antepartum, second trimester; [place] [place]; History of heroin abuse; and Female genital lesion on her problem list.  Patient reports occasional contractions.  Denies HA, vision changes or epigastric pain.  Contractions: Irregular. Vag. Bleeding: None.  Movement: Present. Denies leaking of fluid.   The following portions of the patient's history were reviewed and updated as appropriate: allergies, current medications, past family history, past medical history, past social history, past surgical history and problem list. Problem list updated.  Objective:   Vitals:   02/21/17 0839 02/21/17 0843  BP: (!) 126/97 132/83  Pulse: (!) 105 86  Weight: 214 lb 12.8 oz (97.4 kg)     Fetal Status: Fetal Heart Rate (bpm): 152   Movement: Present     General:  Alert, oriented and cooperative. Patient is in no acute distress.  Skin: Skin is warm and dry. No rash noted.   Cardiovascular: Normal heart rate noted  Respiratory: Normal respiratory effort, no problems with respiration noted  Abdomen: Soft, gravid, appropriate for gestational age.  Pain/Pressure: Absent     Pelvic: Cervical exam performed        Extremities: Normal range of motion.  Edema: Moderate pitting, indentation subsides rapidly  Mental Status:  Normal mood and affect. Normal behavior. Normal judgment and thought content.   Assessment and Plan:  Pregnancy: G2P0010 at 329w5d1. Supervision of high risk pregnancy, antepartum, second trimester  - Culture, beta strep (group b only)  2. Methadone maintenance treatment affecting pregnancy, [place] ([facility] - Culture, beta strep (group b only)  3. Screening for cervical cancer  - Cytology  - PAP  4. Hypertension affecting pregnancy in third trimester  - CBC - Comp Met (CMET) - Protein / creatinine ratio, urine  Pre-E precautions.  Term labor symptoms and general obstetric precautions including but not limited to vaginal bleeding, contractions, leaking of fluid and fetal movement were reviewed in detail with the patient. Please refer to After Visit Summary for other counseling recommendations.  Return in about 1 week (around 02/28/2017) for ROB.   ViManya SilvasCNM

## 2017-02-21 NOTE — Patient Instructions (Addendum)
Hypertension During Pregnancy Hypertension, commonly called high blood pressure, is when the force of blood pumping through your arteries is too strong. Arteries are blood vessels that carry blood from the heart throughout the body. Hypertension during pregnancy can cause problems for you and your baby. Your baby may be born early (prematurely) or may not weigh as much as he or she should at birth. Very bad cases of hypertension during pregnancy can be life-threatening. Different types of hypertension can occur during pregnancy. These include:  Chronic hypertension. This happens when: ? You have hypertension before pregnancy and it continues during pregnancy. ? You develop hypertension before you are [redacted] weeks pregnant, and it continues during pregnancy.  Gestational hypertension. This is hypertension that develops after the 20th week of pregnancy.  Preeclampsia, also called toxemia of pregnancy. This is a very serious type of hypertension that develops only during pregnancy. It affects the whole body, and it can be very dangerous for you and your baby.  Gestational hypertension and preeclampsia usually go away within 6 weeks after your baby is born. Women who have hypertension during pregnancy have a greater chance of developing hypertension later in life or during future pregnancies. What are the causes? The exact cause of hypertension is not known. What increases the risk? There are certain factors that make it more likely for you to develop hypertension during pregnancy. These include:  Having hypertension during a previous pregnancy or prior to pregnancy.  Being overweight.  Being older than age 107.  Being pregnant for the first time or being pregnant with more than one baby.  Becoming pregnant using fertilization methods such as IVF (in vitro fertilization).  Having diabetes, kidney problems, or systemic lupus erythematosus.  Having a family history of hypertension.  What are the  signs or symptoms? Chronic hypertension and gestational hypertension rarely cause symptoms. Preeclampsia causes symptoms, which may include:  Increased protein in your urine. Your health care provider will check for this at every visit before you give birth (prenatal visit).  Severe headaches.  Sudden weight gain.  Swelling of the hands, face, legs, and feet.  Nausea and vomiting.  Vision problems, such as blurred or double vision.  Numbness in the face, arms, legs, and feet.  Dizziness.  Slurred speech.  Sensitivity to bright lights.  Abdominal pain.  Convulsions.  How is this diagnosed? You may be diagnosed with hypertension during a routine prenatal exam. At each prenatal visit, you may:  Have a urine test to check for high amounts of protein in your urine.  Have your blood pressure checked. A blood pressure reading is recorded as two numbers, such as "120 over 80" (or 120/80). The first ("top") number is called the systolic pressure. It is a measure of the pressure in your arteries when your heart beats. The second ("bottom") number is called the diastolic pressure. It is a measure of the pressure in your arteries as your heart relaxes between beats. Blood pressure is measured in a unit called mm Hg. A normal blood pressure reading is: ? Systolic: below 235. ? Diastolic: below 80.  The type of hypertension that you are diagnosed with depends on your test results and when your symptoms developed.  Chronic hypertension is usually diagnosed before 20 weeks of pregnancy.  Gestational hypertension is usually diagnosed after 20 weeks of pregnancy.  Hypertension with high amounts of protein in the urine is diagnosed as preeclampsia.  Blood pressure measurements that stay above 573 systolic, or above 220 diastolic, are  signs of severe preeclampsia.  How is this treated? Treatment for hypertension during pregnancy varies depending on the type of hypertension you have and how  serious it is.  If you take medicines called ACE inhibitors to treat chronic hypertension, you may need to switch medicines. ACE inhibitors should not be taken during pregnancy.  If you have gestational hypertension, you may need to take blood pressure medicine.  If you are at risk for preeclampsia, your health care provider may recommend that you take a low-dose aspirin every day to prevent high blood pressure during your pregnancy.  If you have severe preeclampsia, you may need to be hospitalized so you and your baby can be monitored closely. You may also need to take medicine (magnesium sulfate) to prevent seizures and to lower blood pressure. This medicine may be given as an injection or through an IV tube.  In some cases, if your condition gets worse, you may need to deliver your baby early.  Follow these instructions at home: Eating and drinking  Drink enough fluid to keep your urine clear or pale yellow.  Eat a healthy diet that is low in salt (sodium). Do not add salt to your food. Check food labels to see how much sodium a food or beverage contains. Lifestyle  Do not use any products that contain nicotine or tobacco, such as cigarettes and e-cigarettes. If you need help quitting, ask your health care provider.  Do not use alcohol.  Avoid caffeine.  Avoid stress as much as possible. Rest and get plenty of sleep. General instructions  Take over-the-counter and prescription medicines only as told by your health care provider.  While lying down, lie on your left side. This keeps pressure off your baby.  While sitting or lying down, raise (elevate) your feet. Try putting some pillows under your lower legs.  Exercise regularly. Ask your health care provider what kinds of exercise are best for you.  Keep all prenatal and follow-up visits as told by your health care provider. This is important. Contact a health care provider if:  You have symptoms that your health care  provider told you may require more treatment or monitoring, such as: ? Fever. ? Vomiting. ? Headache. Get help right away if:  You have severe abdominal pain or vomiting that does not get better with treatment.  You suddenly develop swelling in your hands, ankles, or face.  You gain 4 lbs (1.8 kg) or more in 1 week.  You develop vaginal bleeding, or you have blood in your urine.  You do not feel your baby moving as much as usual.  You have blurred or double vision.  You have muscle twitching or sudden tightening (spasms).  You have shortness of breath.  Your lips or fingernails turn blue. This information is not intended to replace advice given to you by your health care provider. Make sure you discuss any questions you have with your health care provider. Document Released: 02/14/2011 Document Revised: 12/17/2015 Document Reviewed: 11/12/2015 Elsevier Interactive Patient Education  2018 [employer]   Breastfeeding Challenges and Solutions Even though breastfeeding is natural, it can be challenging, especially in the first few weeks after childbirth. It is normal for problems to arise when starting to breastfeed your new baby, even if you have breastfed before. This document provides some solutions to the most common breastfeeding challenges. Challenges and solutions Challenge-Cracked or Sore Nipples Cracked or sore nipples are commonly experienced by breastfeeding mothers. Cracked or sore nipples often are  caused by inadequate latching (when your baby's mouth attaches to your breast to breastfeed). Soreness can also happen if your baby is not positioned properly at your breast. Although nipple cracking and soreness are common during the first week after birth, nipple pain is never normal. If you experience nipple cracking or soreness that lasts longer than 1 week or nipple pain, call your health care provider or lactation consultant. Solution Ensure proper latching and  positioning of your baby by following the steps below:  Find a comfortable place to sit or lie down, with your neck and back well supported.  Place a pillow or rolled up blanket under your baby to bring him or her to the level of your breast (if you are seated).  Make sure that your baby's abdomen is facing your abdomen.  Gently massage your breast. With your fingertips, massage from your chest wall toward your nipple in a circular motion. This encourages milk flow. You may need to continue this action during the feeding if your milk flows slowly.  Support your breast with 4 fingers underneath and your thumb above your nipple. Make sure your fingers are well away from your nipple and your baby's mouth.  Stroke your baby's lips gently with your finger or nipple.  When your baby's mouth is open wide enough, quickly bring your baby to your breast, placing your entire nipple and as much of the colored area around your nipple (areola) as possible into your baby's mouth. ? More areola should be visible above your baby's upper lip than below the lower lip. ? Your baby's tongue should be between his or her lower gum and your breast.  Ensure that your baby's mouth is correctly positioned around your nipple (latched). Your baby's lips should create a seal on your breast and be turned out (everted).  It is common for your baby to suck for about 2-3 minutes in order to start the flow of breast milk.  Signs that your baby has successfully latched on to your nipple include:  Quietly tugging or quietly sucking without causing you pain.  Swallowing heard between every 3-4 sucks.  Muscle movement above and in front of his or her ears with sucking.  Signs that your baby has not successfully latched on to nipple include:  Sucking sounds or smacking sounds from your baby while nursing.  Nipple pain.  Ensure that your breasts stay moisturized and healthy by:  Avoiding the use of soap on your  nipples.  Wearing a supportive bra. Avoid wearing underwire-style bras or tight bras.  Air drying your nipples for 3-4 minutes after each feeding.  Using only cotton bra pads to absorb breast milk leakage. Leaking of breast milk between feedings is normal. Be sure to change the pads if they become soaked with milk.  Using lanolin on your nipples after nursing. Lanolin helps to maintain your skin's normal moisture barrier. If you use pure lanolin you do not need to wash it off before feeding your baby again. Pure lanolin is not toxic to your baby. You may also hand express a few drops of breast milk and gently massage that milk into your nipples, allowing it to air dry.  Challenge-Breast Engorgement Breast engorgement is the overfilling of your breasts with breast milk. In the first few weeks after giving birth, you may experience breast engorgement. Breast engorgement can make your breasts throb and feel hard, tightly stretched, warm, and tender. Engorgement peaks about the fifth day after you give birth. Having  breast engorgement does not mean you have to stop breastfeeding your baby. Solution  Breastfeed when you feel the need to reduce the fullness of your breasts or when your baby shows signs of hunger. This is called "breastfeeding on demand."  Newborns (babies younger than 4 weeks) often breastfeed every 1-3 hours during the day. You may need to awaken your baby to feed if he or she is asleep at a feeding time.  Do not allow your baby to sleep longer than 5 hours during the night without a feeding.  Pump or hand express breast milk before breastfeeding to soften your breast, areola, and nipple.  Apply warm, moist heat (in the shower or with warm water-soaked hand towels) just before feeding or pumping, or massage your breast before or during breastfeeding. This increases circulation and helps your milk to flow.  Completely empty your breasts when breastfeeding or pumping. Afterward,  wear a snug bra (nursing or regular) or tank top for 1-2 days to signal your body to slightly decrease milk production. Only wear snug bras or tank tops to treat engorgement. Tight bras typically should be avoided by breastfeeding mothers. Once engorgement is relieved, return to wearing regular, loose-fitting clothes.  Apply ice packs to your breasts to lessen the pain from engorgement and relieve swelling, unless the ice is uncomfortable for you.  Do not delay feedings. Try to relax when it is time to feed your baby. This helps to trigger your "let-down reflex," which releases milk from your breast.  Ensure your baby is latched on to your breast and positioned properly while breastfeeding.  Allow your baby to remain at your breast as long as he or she is latched on well and actively sucking. Your baby will let you know when he or she is done breastfeeding by pulling away from your breast or falling asleep.  Avoid introducing bottles or pacifiers to your baby in the early weeks of breastfeeding. Wait to introduce these things until after resolving any breastfeeding challenges.  Try to pump your milk on the same schedule as when your baby would breastfeed if you are returning to work or away from home for an extended period.  Drink plenty of fluids to avoid dehydration, which can eventually put you at greater risk of breast engorgement.  If you follow these suggestions, your engorgement should improve in 24-48 hours. If you are still experiencing difficulty, call your lactation consultant or health care provider. Challenge-Plugged Milk Ducts Plugged milk ducts occur when the duct does not drain milk effectively and becomes swollen. Wearing a tight-fitting nursing bra or having difficulty with latching may cause plugged milk ducts. Not drinking enough water (8-10 c [1.9-2.4 L] per day) can contribute to plugged milk ducts. Once a duct has become plugged, hard lumps, soreness, and redness may develop  in your breast. Solution Do not delay feedings. Feed your baby frequently and try to empty your breasts of milk at each feeding. Try breastfeeding from the affected side first so there is a better chance that the milk will drain completely from that breast. Apply warm, moist towels to your breasts for 5-10 minutes before feeding. Alternatively, a hot shower right before breastfeeding can provide the moist heat that can encourage milk flow. Gentle massage of the sore area before and during a feeding may also help. Avoid wearing tight clothing or bras that put pressure on your breasts. Wear bras that offer good support to your breasts, but avoid underwire bras. If you have a  plugged milk duct and develop a fever, you need to see your health care provider. Challenge-Mastitis Mastitis is inflammation of your breast. It usually is caused by a bacterial infection and can cause flu-like symptoms. You may develop redness in your breast and a fever. Often when mastitis occurs, your breast becomes firm, warm, and very painful. The most common causes of mastitis are poor latching, ineffective sucking from your baby, consistent pressure on your breast (possibly from wearing a tight-fitting bra or shirt that restricts the milk flow), unusual stress or fatigue, or missed feedings. Solution You will be given antibiotic medicine to treat the infection. It is still important to breastfeed frequently to empty your breasts. Continuing to breastfeed while you recover from mastitis will not harm your baby. Make sure your baby is positioned properly during every feeding. Apply moist heat to your breasts for a few minutes before feeding to help the milk flow and to help your breasts empty more easily. Challenge-Thrush Ginette Pitman is a yeast infection that can form on your nipples, in your breast, or in your baby's mouth. It causes itching, soreness, burning or stabbing pain, and sometimes a rash. Solution You will be given a  medicated ointment for your nipples, and your baby will be given a liquid medicine for his or her mouth. It is important that you and your baby are treated at the same time because thrush can be passed between you and your baby. Change disposable nursing pads often. Any bras, towels, or clothing that come in contact with infected areas of your body or your baby's body need to be washed in very hot water every day. Wash your hands and your baby's hands often. All pacifiers, bottle nipples, or toys your baby puts in his or her mouth should be boiled once a day for 20 minutes. After 1 week of treatment, discard pacifiers and bottle nipples and buy new ones. All breast pump parts that touch the milk need to be boiled for 20 minutes every day. Challenge-Low Milk Supply You may not be producing enough milk if your baby is not gaining the proper amount of weight. Breast milk production is based on a supply-and-demand system. Your milk supply depends on how frequently and effectively your baby empties your breast. Solution The more you breastfeed and pump, the more breast milk you will produce. It is important that your baby empties at least one of your breasts at each feeding. If this is not happening, then use a breast pump or hand express any milk that remains. This will help to drain as much milk as possible at each feeding. It will also signal your body to produce more milk. If your baby is not emptying your breasts, it may be due to latching, sucking, or positioning problems. If low milk supply continues after addressing these issues, contact your health care provider or a lactation specialist as soon as possible. Challenge-Inverted or Flat Nipples Some women have nipples that turn inward instead of protruding outward. Other women have nipples that are flat. Inverted or flat nipples can sometimes make it more difficult for your baby to latch onto your breast. Solution You may be given a small device that pulls  out inverted nipples. This device should be applied right before your baby is brought to your breast. You can also try using a breast pump for a short time before placing the baby at your breast. The pump can pull your nipple outwards to help your infant latch more easily. The  baby's sucking motion will help the inverted nipple protrude as well. If you have flat nipples, encourage your baby to latch onto your breast and feed frequently in the early days after birth. This will give your baby practice latching on correctly while your breast is still soft. When your milk supply increases, between the second and fifth day after birth and your breasts become full, your baby will have an easier time latching. Contact a lactation consultant if you still have concerns. She or he can teach you additional techniques to address breastfeeding problems related to nipple shape and position. Where to find more information: [employer]: www.llli.org This information is not intended to replace advice given to you by your health care provider. Make sure you discuss any questions you have with your health care provider. Document Released: 11/20/2005 Document Revised: 11/10/2015 Document Reviewed: 11/22/2012 Elsevier Interactive Patient Education  2017 [employer].

## 2017-02-21 NOTE — Progress Notes (Signed)
Pt informed that the ultrasound is considered a limited OB ultrasound and is not intended to be a complete ultrasound exam.  Patient also informed that the ultrasound is not being completed with the intent of assessing for fetal or placental anomalies or any pelvic abnormalities.  Explained that the purpose of today's ultrasound is to assess for presentation.  Patient acknowledges the purpose of the exam and the limitations of the study.   Vertex presentation Fetal movement observed during ultrasound FHR - 134 bpm

## 2017-02-22 LAB — COMPREHENSIVE METABOLIC PANEL
A/G RATIO: 1.3 (ref 1.2–2.2)
ALBUMIN: 3.3 g/dL — AB (ref 3.5–5.5)
ALT: 15 IU/L (ref 0–32)
AST: 26 IU/L (ref 0–40)
Alkaline Phosphatase: 99 IU/L (ref 39–117)
BILIRUBIN TOTAL: 0.2 mg/dL (ref 0.0–1.2)
BUN/Creatinine Ratio: 10 (ref 9–23)
BUN: 7 mg/dL (ref 6–20)
CO2: 20 mmol/L (ref 20–29)
Calcium: 8.7 mg/dL (ref 8.7–10.2)
Chloride: 102 mmol/L (ref 96–106)
Creatinine, Ser: 0.69 mg/dL (ref 0.57–1.00)
GFR calc Af Amer: 137 mL/min/{1.73_m2} (ref >59)
GFR, EST NON AFRICAN AMERICAN: 119 mL/min/{1.73_m2} (ref >59)
GLOBULIN, TOTAL: 2.6 g/dL (ref 1.5–4.5)
Glucose: 94 mg/dL (ref 65–99)
Potassium: 4.8 mmol/L (ref 3.5–5.2)
SODIUM: 137 mmol/L (ref 134–144)
Total Protein: 5.9 g/dL — ABNORMAL LOW (ref 6.0–8.5)

## 2017-02-22 LAB — CBC
HEMOGLOBIN: 12 g/dL (ref 11.1–15.9)
Hematocrit: 35.4 % (ref 34.0–46.6)
MCH: 29.6 pg (ref 26.6–33.0)
MCHC: 33.9 g/dL (ref 31.5–35.7)
MCV: 87 fL (ref 79–97)
PLATELETS: 231 10*3/uL (ref 150–379)
RBC: 4.05 x10E6/uL (ref 3.77–5.28)
RDW: 13.9 % (ref 12.3–15.4)
WBC: 7.5 10*3/uL (ref 3.4–10.8)

## 2017-02-22 LAB — PROTEIN / CREATININE RATIO, URINE
Creatinine, Urine: 120.4 mg/dL
PROTEIN UR: 14.8 mg/dL
PROTEIN/CREAT RATIO: 123 mg/g{creat} (ref 0–200)

## 2017-02-23 DIAGNOSIS — O163 Unspecified maternal hypertension, third trimester: Secondary | ICD-10-CM | POA: Insufficient documentation

## 2017-02-23 LAB — CYTOLOGY - PAP
Adequacy: ABSENT
CHLAMYDIA, DNA PROBE: NEGATIVE
DIAGNOSIS: NEGATIVE
NEISSERIA GONORRHEA: NEGATIVE

## 2017-02-23 NOTE — Telephone Encounter (Signed)
Called patient- no answer or voicemail to leave a message. 

## 2017-02-25 LAB — CULTURE, BETA STREP (GROUP B ONLY): STREP GP B CULTURE: NEGATIVE

## 2017-02-28 ENCOUNTER — Ambulatory Visit (INDEPENDENT_AMBULATORY_CARE_PROVIDER_SITE_OTHER): Payer: Medicaid Other | Admitting: Medical

## 2017-02-28 DIAGNOSIS — O0992 Supervision of high risk pregnancy, unspecified, second trimester: Secondary | ICD-10-CM

## 2017-02-28 NOTE — Progress Notes (Signed)
   PRENATAL VISIT NOTE  Subjective:  Jessica Huffman is a 26 y.o. G2P0010 at [redacted]w[redacted]d being seen today for ongoing prenatal care.  She is currently monitored for the following issues for this low-risk pregnancy and has Supervision of high risk pregnancy, antepartum, second trimester; Methadone maintenance treatment affecting pregnancy (HCC); History of heroin abuse; Female genital lesion; and Hypertension affecting pregnancy in third trimester on her problem list.  Patient reports no complaints.  Contractions: Irregular. Vag. Bleeding: None.  Movement: Present. Denies leaking of fluid.   The following portions of the patient's history were reviewed and updated as appropriate: allergies, current medications, past family history, past medical history, past social history, past surgical history and problem list. Problem list updated.  Objective:   Vitals:   02/28/17 0842  BP: 124/84  Pulse: (!) 115  Weight: 216 lb (98 kg)    Fetal Status: Fetal Heart Rate (bpm): 150 Fundal Height: 38 cm Movement: Present     General:  Alert, oriented and cooperative. Patient is in no acute distress.  Skin: Skin is warm and dry. No rash noted.   Cardiovascular: Normal heart rate noted  Respiratory: Normal respiratory effort, no problems with respiration noted  Abdomen: Soft, gravid, appropriate for gestational age.  Pain/Pressure: Absent     Pelvic: Cervical exam deferred        Extremities: Normal range of motion.  Edema: Moderate pitting, indentation subsides rapidly  Mental Status:  Normal mood and affect. Normal behavior. Normal judgment and thought content.   Assessment and Plan:  Pregnancy: G2P0010 at [redacted]w[redacted]d  1. Supervision of high risk pregnancy, antepartum, second trimester - Doing well - Does not think she will have waterbirth, but has taken class, so signed consent today in case  Term labor symptoms and general obstetric precautions including but not limited to vaginal bleeding, contractions,  leaking of fluid and fetal movement were reviewed in detail with the patient. Please refer to After Visit Summary for other counseling recommendations.  Return in about 1 week (around 03/07/2017) for LOB.   Vonzella Nipple, PA-C

## 2017-02-28 NOTE — Patient Instructions (Signed)
Fetal Movement Counts °Patient Name: ________________________________________________ Patient Due Date: ____________________ °What is a fetal movement count? °A fetal movement count is the number of times that you feel your baby move during a certain amount of time. This may also be called a fetal kick count. A fetal movement count is recommended for every pregnant woman. You may be asked to start counting fetal movements as early as week 28 of your pregnancy. °Pay attention to when your baby is most active. You may notice your baby's sleep and wake cycles. You may also notice things that make your baby move more. You should do a fetal movement count: °· When your baby is normally most active. °· At the same time each day. ° °A good time to count movements is while you are resting, after having something to eat and drink. °How do I count fetal movements? °1. Find a quiet, comfortable area. Sit, or lie down on your side. °2. Write down the date, the start time and stop time, and the number of movements that you felt between those two times. Take this information with you to your health care visits. °3. For 2 hours, count kicks, flutters, swishes, rolls, and jabs. You should feel at least 10 movements during 2 hours. °4. You may stop counting after you have felt 10 movements. °5. If you do not feel 10 movements in 2 hours, have something to eat and drink. Then, keep resting and counting for 1 hour. If you feel at least 4 movements during that hour, you may stop counting. °Contact a health care provider if: °· You feel fewer than 4 movements in 2 hours. °· Your baby is not moving like he or she usually does. °Date: ____________ Start time: ____________ Stop time: ____________ Movements: ____________ °Date: ____________ Start time: ____________ Stop time: ____________ Movements: ____________ °Date: ____________ Start time: ____________ Stop time: ____________ Movements: ____________ °Date: ____________ Start time:  ____________ Stop time: ____________ Movements: ____________ °Date: ____________ Start time: ____________ Stop time: ____________ Movements: ____________ °Date: ____________ Start time: ____________ Stop time: ____________ Movements: ____________ °Date: ____________ Start time: ____________ Stop time: ____________ Movements: ____________ °Date: ____________ Start time: ____________ Stop time: ____________ Movements: ____________ °Date: ____________ Start time: ____________ Stop time: ____________ Movements: ____________ °This information is not intended to replace advice given to you by your health care provider. Make sure you discuss any questions you have with your health care provider. °Document Released: 06/28/2006 Document Revised: 01/26/2016 Document Reviewed: 07/08/2015 °Elsevier Interactive Patient Education © 2018 [employer] °Braxton Hicks Contractions °Contractions of the uterus can occur throughout pregnancy, but they are not always a sign that you are in labor. You may have practice contractions called Braxton Hicks contractions. These false labor contractions are sometimes confused with true labor. °What are Braxton Hicks contractions? °Braxton Hicks contractions are tightening movements that occur in the muscles of the uterus before labor. Unlike true labor contractions, these contractions do not result in opening (dilation) and thinning of the cervix. Toward the end of pregnancy (32-34 weeks), Braxton Hicks contractions can happen more often and may become stronger. These contractions are sometimes difficult to tell apart from true labor because they can be very uncomfortable. You should not feel embarrassed if you go to the hospital with false labor. °Sometimes, the only way to tell if you are in true labor is for your health care provider to look for changes in the cervix. The health care provider will do a physical exam and may monitor your contractions. If   you are not in true labor, the exam  should show that your cervix is not dilating and your water has not broken. °If there are no prenatal problems or other health problems associated with your pregnancy, it is completely safe for you to be sent home with false labor. You may continue to have Braxton Hicks contractions until you go into true labor. °How can I tell the difference between true labor and false labor? °· Differences °? False labor °? Contractions last 30-70 seconds.: Contractions are usually shorter and not as strong as true labor contractions. °? Contractions become very regular.: Contractions are usually irregular. °? Discomfort is usually felt in the top of the uterus, and it spreads to the lower abdomen and low back.: Contractions are often felt in the front of the lower abdomen and in the groin. °? Contractions do not go away with walking.: Contractions may go away when you walk around or change positions while lying down. °? Contractions usually become more intense and increase in frequency.: Contractions get weaker and are shorter-lasting as time goes on. °? The cervix dilates and gets thinner.: The cervix usually does not dilate or become thin. °Follow these instructions at home: °· Take over-the-counter and prescription medicines only as told by your health care provider. °· Keep up with your usual exercises and follow other instructions from your health care provider. °· Eat and drink lightly if you think you are going into labor. °· If Braxton Hicks contractions are making you uncomfortable: °? Change your position from lying down or resting to walking, or change from walking to resting. °? Sit and rest in a tub of warm water. °? Drink enough fluid to keep your urine clear or pale yellow. Dehydration may cause these contractions. °? Do slow and deep breathing several times an hour. °· Keep all follow-up prenatal visits as told by your health care provider. This is important. °Contact a health care provider if: °· You have a  fever. °· You have continuous pain in your abdomen. °Get help right away if: °· Your contractions become stronger, more regular, and closer together. °· You have fluid leaking or gushing from your vagina. °· You pass blood-tinged mucus (bloody show). °· You have bleeding from your vagina. °· You have low back pain that you never had before. °· You feel your baby’s head pushing down and causing pelvic pressure. °· Your baby is not moving inside you as much as it used to. °Summary °· Contractions that occur before labor are called Braxton Hicks contractions, false labor, or practice contractions. °· Braxton Hicks contractions are usually shorter, weaker, farther apart, and less regular than true labor contractions. True labor contractions usually become progressively stronger and regular and they become more frequent. °· Manage discomfort from Braxton Hicks contractions by changing position, resting in a warm bath, drinking plenty of water, or practicing deep breathing. °This information is not intended to replace advice given to you by your health care provider. Make sure you discuss any questions you have with your health care provider. °Document Released: 05/29/2005 Document Revised: 04/17/2016 Document Reviewed: 04/17/2016 °Elsevier Interactive Patient Education © 2017 Else[employer] Inc[employer] ° °

## 2017-03-01 ENCOUNTER — Encounter: Payer: Self-pay | Admitting: *Deleted

## 2017-03-07 ENCOUNTER — Ambulatory Visit (INDEPENDENT_AMBULATORY_CARE_PROVIDER_SITE_OTHER): Payer: Medicaid Other | Admitting: Advanced Practice Midwife

## 2017-03-07 DIAGNOSIS — O0992 Supervision of high risk pregnancy, unspecified, second trimester: Secondary | ICD-10-CM

## 2017-03-07 NOTE — Progress Notes (Signed)
   PRENATAL VISIT NOTE  Subjective:  Jessica Huffman is a 26 y.o. G2P0010 at [redacted]w[redacted]d being seen today for ongoing prenatal care.  She is currently monitored for the following issues for this high-risk pregnancy and has Supervision of high risk pregnancy, antepartum, second trimester; Methadone maintenance treatment affecting pregnancy (HCC); History of heroin abuse; Female genital lesion; and Hypertension affecting pregnancy in third trimester on her problem list.  Patient reports no complaints.  Contractions: Irregular. Vag. Bleeding: None.  Movement: Present. Denies leaking of fluid.   The following portions of the patient's history were reviewed and updated as appropriate: allergies, current medications, past family history, past medical history, past social history, past surgical history and problem list. Problem list updated.  Objective:   Vitals:   03/07/17 0908  BP: 126/88  Pulse: 95  Weight: 219 lb (99.3 kg)    Fetal Status: Fetal Heart Rate (bpm): 130 Fundal Height: 39 cm Movement: Present     General:  Alert, oriented and cooperative. Patient is in no acute distress.  Skin: Skin is warm and dry. No rash noted.   Cardiovascular: Normal heart rate noted  Respiratory: Normal respiratory effort, no problems with respiration noted  Abdomen: Soft, gravid, appropriate for gestational age.  Pain/Pressure: Present     Pelvic: Cervical exam deferred        Extremities: Normal range of motion.  Edema: Moderate pitting, indentation subsides rapidly  Mental Status:  Normal mood and affect. Normal behavior. Normal judgment and thought content.   Assessment and Plan:  Pregnancy: G2P0010 at [redacted]w[redacted]d  1. Supervision of high risk pregnancy, antepartum, second trimester - Lengthy discussion of comfort measures, pain relief in labor. Not planning waterbirth any more, but interested in using shower, Nitrous.   2. Methadone pregnancy  Term labor symptoms and general obstetric precautions including  but not limited to vaginal bleeding, contractions, leaking of fluid and fetal movement were reviewed in detail with the patient. Please refer to After Visit Summary for other counseling recommendations.  Return in about 1 week (around 03/14/2017) for ROB.   Dorathy Kinsman, CNM

## 2017-03-07 NOTE — Patient Instructions (Signed)

## 2017-03-14 ENCOUNTER — Encounter: Payer: Self-pay | Admitting: Advanced Practice Midwife

## 2017-03-14 ENCOUNTER — Ambulatory Visit (INDEPENDENT_AMBULATORY_CARE_PROVIDER_SITE_OTHER): Payer: Medicaid Other | Admitting: Advanced Practice Midwife

## 2017-03-14 VITALS — BP 125/85 | HR 76 | Wt 220.3 lb

## 2017-03-14 DIAGNOSIS — O0993 Supervision of high risk pregnancy, unspecified, third trimester: Secondary | ICD-10-CM

## 2017-03-14 DIAGNOSIS — O0992 Supervision of high risk pregnancy, unspecified, second trimester: Secondary | ICD-10-CM

## 2017-03-14 DIAGNOSIS — O99323 Drug use complicating pregnancy, third trimester: Secondary | ICD-10-CM

## 2017-03-14 DIAGNOSIS — F1120 Opioid dependence, uncomplicated: Secondary | ICD-10-CM

## 2017-03-14 NOTE — Patient Instructions (Signed)
Labor Induction Labor induction is when steps are taken to cause a pregnant woman to begin the labor process. Most women go into labor on their own between 37 weeks and 42 weeks of the pregnancy. When this does not happen or when there is a medical need, methods may be used to induce labor. Labor induction causes a pregnant woman's uterus to contract. It also causes the cervix to soften (ripen), open (dilate), and thin out (efface). Usually, labor is not induced before 39 weeks of the pregnancy unless there is a problem with the baby or mother. Before inducing labor, your health care provider will consider a number of factors, including the following:  The medical condition of you and the baby.  How many weeks along you are.  The status of the baby's lung maturity.  The condition of the cervix.  The position of the baby. What are the reasons for labor induction? Labor may be induced for the following reasons:  The health of the baby or mother is at risk.  The pregnancy is overdue by 1 week or more.  The water breaks but labor does not start on its own.  The mother has a health condition or serious illness, such as high blood pressure, infection, placental abruption, or diabetes.  The amniotic fluid amounts are low around the baby.  The baby is distressed. Convenience or wanting the baby to be born on a certain date is not a reason for inducing labor. What methods are used for labor induction? Several methods of labor induction may be used, such as:  Prostaglandin medicine. This medicine causes the cervix to dilate and ripen. The medicine will also start contractions. It can be taken by mouth or by inserting a suppository into the vagina.  Inserting a thin tube (catheter) with a balloon on the end into the vagina to dilate the cervix. Once inserted, the balloon is expanded with water, which causes the cervix to open.  Stripping the membranes. Your health care provider separates  amniotic sac tissue from the cervix, causing the cervix to be stretched and causing the release of a hormone called progesterone. This may cause the uterus to contract. It is often done during an office visit. You will be sent home to wait for the contractions to begin. You will then come in for an induction.  Breaking the water. Your health care provider makes a hole in the amniotic sac using a small instrument. Once the amniotic sac breaks, contractions should begin. This may still take hours to see an effect.  Medicine to trigger or strengthen contractions. This medicine is given through an IV access tube inserted into a vein in your arm. All of the methods of induction, besides stripping the membranes, will be done in the hospital. Induction is done in the hospital so that you and the baby can be carefully monitored. How long does it take for labor to be induced? Some inductions can take up to 2-3 days. Depending on the cervix, it usually takes less time. It takes longer when you are induced early in the pregnancy or if this is your first pregnancy. If a mother is still pregnant and the induction has been going on for 2-3 days, either the mother will be sent home or a cesarean delivery will be needed. What are the risks associated with labor induction? Some of the risks of induction include:  Changes in fetal heart rate, such as too high, too low, or erratic.  Fetal distress.    Chance of infection for the mother and baby.  Increased chance of having a cesarean delivery.  Breaking off (abruption) of the placenta from the uterus (rare).  Uterine rupture (very rare). When induction is needed for medical reasons, the benefits of induction may outweigh the risks. What are some reasons for not inducing labor? Labor induction should not be done if:  It is shown that your baby does not tolerate labor.  You have had previous surgeries on your uterus, such as a myomectomy or the removal of  fibroids.  Your placenta lies very low in the uterus and blocks the opening of the cervix (placenta previa).  Your baby is not in a head-down position.  The umbilical cord drops down into the birth canal in front of the baby. This could cut off the baby's blood and oxygen supply.  You have had a previous cesarean delivery.  There are unusual circumstances, such as the baby being extremely premature. This information is not intended to replace advice given to you by your health care provider. Make sure you discuss any questions you have with your health care provider. Document Released: 10/18/2006 Document Revised: 11/04/2015 Document Reviewed: 12/26/2012 Elsevier Interactive Patient Education  2017 Elsevier Inc.  

## 2017-03-14 NOTE — Progress Notes (Signed)
   PRENATAL VISIT NOTE  Subjective:  Jessica Huffman is a 26 y.o. G2P0010 at [redacted]w[redacted]d being seen today for ongoing prenatal care.  She is currently monitored for the following issues for this high-risk pregnancy and has Supervision of high risk pregnancy, antepartum, second trimester; Methadone maintenance treatment affecting pregnancy (HCC); History of heroin abuse; Female genital lesion; and Hypertension affecting pregnancy in third trimester on her problem list.  Patient reports occasional contractions and pedal edema..  Contractions: Irregular. Vag. Bleeding: None.  Movement: Present. Denies leaking of fluid.   The following portions of the patient's history were reviewed and updated as appropriate: allergies, current medications, past family history, past medical history, past social history, past surgical history and problem list. Problem list updated.  Objective:   Vitals:   03/14/17 0911  BP: 125/85  Pulse: 76  Weight: 220 lb 4.8 oz (99.9 kg)    Fetal Status: Fetal Heart Rate (bpm): 120 Fundal Height: 40 cm Movement: Present  Presentation: Vertex  General:  Alert, oriented and cooperative. Patient is in no acute distress.  Skin: Skin is warm and dry. No rash noted.   Cardiovascular: Normal heart rate noted  Respiratory: Normal respiratory effort, no problems with respiration noted  Abdomen: Soft, gravid, appropriate for gestational age.  Pain/Pressure: Present     Pelvic: Cervical exam performed Dilation: 1 Effacement (%): 50 Station: -2. Very anterior  Extremities: Normal range of motion.  Edema: Deep pitting, indentation remains for a short time  Mental Status:  Normal mood and affect. Normal behavior. Normal judgment and thought content.   Assessment and Plan:  Pregnancy: G2P0010 at [redacted]w[redacted]d  There are no diagnoses linked to this encounter. Term labor symptoms and general obstetric precautions including but not limited to vaginal bleeding, contractions, leaking of fluid and fetal  movement were reviewed in detail with the patient. Please refer to After Visit Summary for other counseling recommendations.  Plan IOL at 41 weeks if not delivered. Pt hopes to avoid induction.   Return in about 5 days (around 03/19/2017) for ROB/NST/AFI.   Dorathy Kinsman, CNM

## 2017-03-20 ENCOUNTER — Inpatient Hospital Stay (HOSPITAL_COMMUNITY)
Admission: AD | Admit: 2017-03-20 | Discharge: 2017-03-24 | DRG: 787 | Disposition: A | Discharge status: Home / Self Care | Payer: Medicaid Other | Source: Ambulatory Visit | Attending: Obstetrics & Gynecology | Admitting: Obstetrics & Gynecology

## 2017-03-20 ENCOUNTER — Encounter: Payer: Self-pay | Admitting: Family Medicine

## 2017-03-20 ENCOUNTER — Ambulatory Visit (INDEPENDENT_AMBULATORY_CARE_PROVIDER_SITE_OTHER): Payer: Medicaid Other | Admitting: Family Medicine

## 2017-03-20 ENCOUNTER — Encounter (HOSPITAL_COMMUNITY): Payer: Self-pay

## 2017-03-20 ENCOUNTER — Other Ambulatory Visit: Payer: Medicaid Other

## 2017-03-20 ENCOUNTER — Other Ambulatory Visit: Payer: Self-pay | Admitting: Family Medicine

## 2017-03-20 ENCOUNTER — Ambulatory Visit (INDEPENDENT_AMBULATORY_CARE_PROVIDER_SITE_OTHER): Payer: Medicaid Other | Admitting: *Deleted

## 2017-03-20 ENCOUNTER — Ambulatory Visit: Payer: Self-pay

## 2017-03-20 VITALS — BP 131/85 | HR 84 | Wt 224.8 lb

## 2017-03-20 DIAGNOSIS — O480 Post-term pregnancy: Secondary | ICD-10-CM

## 2017-03-20 DIAGNOSIS — Z6841 Body Mass Index (BMI) 40.0 and over, adult: Secondary | ICD-10-CM | POA: Diagnosis not present

## 2017-03-20 DIAGNOSIS — O163 Unspecified maternal hypertension, third trimester: Secondary | ICD-10-CM

## 2017-03-20 DIAGNOSIS — F1190 Opioid use, unspecified, uncomplicated: Secondary | ICD-10-CM | POA: Diagnosis present

## 2017-03-20 DIAGNOSIS — O403XX0 Polyhydramnios, third trimester, not applicable or unspecified: Secondary | ICD-10-CM | POA: Diagnosis present

## 2017-03-20 DIAGNOSIS — O99334 Smoking (tobacco) complicating childbirth: Secondary | ICD-10-CM | POA: Diagnosis present

## 2017-03-20 DIAGNOSIS — D649 Anemia, unspecified: Secondary | ICD-10-CM | POA: Diagnosis present

## 2017-03-20 DIAGNOSIS — F17210 Nicotine dependence, cigarettes, uncomplicated: Secondary | ICD-10-CM | POA: Diagnosis present

## 2017-03-20 DIAGNOSIS — F1111 Opioid abuse, in remission: Secondary | ICD-10-CM

## 2017-03-20 DIAGNOSIS — Z3A40 40 weeks gestation of pregnancy: Secondary | ICD-10-CM

## 2017-03-20 DIAGNOSIS — O99214 Obesity complicating childbirth: Secondary | ICD-10-CM | POA: Diagnosis present

## 2017-03-20 DIAGNOSIS — O99324 Drug use complicating childbirth: Secondary | ICD-10-CM | POA: Diagnosis present

## 2017-03-20 DIAGNOSIS — N949 Unspecified condition associated with female genital organs and menstrual cycle: Secondary | ICD-10-CM

## 2017-03-20 DIAGNOSIS — O0992 Supervision of high risk pregnancy, unspecified, second trimester: Secondary | ICD-10-CM

## 2017-03-20 DIAGNOSIS — O0993 Supervision of high risk pregnancy, unspecified, third trimester: Secondary | ICD-10-CM

## 2017-03-20 DIAGNOSIS — O99323 Drug use complicating pregnancy, third trimester: Secondary | ICD-10-CM

## 2017-03-20 DIAGNOSIS — O9902 Anemia complicating childbirth: Secondary | ICD-10-CM | POA: Diagnosis present

## 2017-03-20 DIAGNOSIS — Z98891 History of uterine scar from previous surgery: Secondary | ICD-10-CM

## 2017-03-20 DIAGNOSIS — F1120 Opioid dependence, uncomplicated: Secondary | ICD-10-CM

## 2017-03-20 LAB — CBC
HEMATOCRIT: 34.7 % — AB (ref 36.0–46.0)
Hemoglobin: 11.8 g/dL — ABNORMAL LOW (ref 12.0–15.0)
MCH: 29.9 pg (ref 26.0–34.0)
MCHC: 34 g/dL (ref 30.0–36.0)
MCV: 87.8 fL (ref 78.0–100.0)
PLATELETS: 223 10*3/uL (ref 150–400)
RBC: 3.95 MIL/uL (ref 3.87–5.11)
RDW: 13.3 % (ref 11.5–15.5)
WBC: 6.2 10*3/uL (ref 4.0–10.5)

## 2017-03-20 LAB — TYPE AND SCREEN
ABO/RH(D): A POS
ANTIBODY SCREEN: NEGATIVE

## 2017-03-20 LAB — POCT URINALYSIS DIP (DEVICE)
BILIRUBIN URINE: NEGATIVE
GLUCOSE, UA: NEGATIVE mg/dL
Ketones, ur: NEGATIVE mg/dL
NITRITE: NEGATIVE
PH: 7 (ref 5.0–8.0)
Protein, ur: 30 mg/dL — AB
Specific Gravity, Urine: >=1.03 (ref 1.005–1.030)
Urobilinogen, UA: 0.2 mg/dL (ref 0.0–1.0)

## 2017-03-20 LAB — ABO/RH: ABO/RH(D): A POS

## 2017-03-20 MED ORDER — LIDOCAINE HCL (PF) 1 % IJ SOLN
30.0000 mL | INTRAMUSCULAR | Status: DC | PRN
  Filled 2017-03-20: qty 30

## 2017-03-20 MED ORDER — SOD CITRATE-CITRIC ACID 500-334 MG/5ML PO SOLN
30.0000 mL | ORAL | Status: DC | PRN
  Administered 2017-03-21 (×2): 30 mL via ORAL
  Filled 2017-03-20 (×2): qty 15

## 2017-03-20 MED ORDER — LACTATED RINGERS IV SOLN
INTRAVENOUS | Status: DC
  Administered 2017-03-20 – 2017-03-22 (×7): via INTRAVENOUS

## 2017-03-20 MED ORDER — FLEET ENEMA 7-19 GM/118ML RE ENEM: 1.0000 | ENEMA | RECTAL | Status: DC | PRN

## 2017-03-20 MED ORDER — METHADONE HCL 10 MG/ML PO CONC
100.0000 mg | ORAL | Status: DC
  Administered 2017-03-21: 100 mg via ORAL
  Filled 2017-03-20 (×2): qty 10

## 2017-03-20 MED ORDER — OXYTOCIN BOLUS FROM INFUSION: 500.0000 mL | Freq: Once | INTRAVENOUS | Status: DC

## 2017-03-20 MED ORDER — TERBUTALINE SULFATE 1 MG/ML IJ SOLN: 0.2500 mg | Freq: Once | INTRAMUSCULAR | Status: DC | PRN

## 2017-03-20 MED ORDER — ZOLPIDEM TARTRATE 5 MG PO TABS: 5.0000 mg | ORAL_TABLET | Freq: Every evening | ORAL | Status: DC | PRN

## 2017-03-20 MED ORDER — MISOPROSTOL 50MCG HALF TABLET
50.0000 ug | ORAL_TABLET | ORAL | Status: DC | PRN
  Administered 2017-03-20: 50 ug via ORAL
  Filled 2017-03-20 (×2): qty 1

## 2017-03-20 MED ORDER — ACETAMINOPHEN 325 MG PO TABS: 650.0000 mg | ORAL_TABLET | ORAL | Status: DC | PRN

## 2017-03-20 MED ORDER — FENTANYL CITRATE (PF) 100 MCG/2ML IJ SOLN
100.0000 ug | INTRAMUSCULAR | Status: DC | PRN
  Administered 2017-03-20: 100 ug via INTRAVENOUS
  Filled 2017-03-20: qty 2

## 2017-03-20 MED ORDER — OXYCODONE-ACETAMINOPHEN 5-325 MG PO TABS: 1.0000 | ORAL_TABLET | ORAL | Status: DC | PRN

## 2017-03-20 MED ORDER — LACTATED RINGERS IV SOLN
500.0000 mL | INTRAVENOUS | Status: DC | PRN
  Administered 2017-03-21 (×3): 500 mL via INTRAVENOUS

## 2017-03-20 MED ORDER — FENTANYL CITRATE (PF) 100 MCG/2ML IJ SOLN: 100.0000 ug | INTRAMUSCULAR | Status: DC | PRN

## 2017-03-20 MED ORDER — OXYCODONE-ACETAMINOPHEN 5-325 MG PO TABS: 2.0000 | ORAL_TABLET | ORAL | Status: DC | PRN

## 2017-03-20 MED ORDER — OXYTOCIN 40 UNITS IN LACTATED RINGERS INFUSION - SIMPLE MED: 2.5000 [IU]/h | INTRAVENOUS | Status: DC

## 2017-03-20 MED ORDER — ONDANSETRON HCL 4 MG/2ML IJ SOLN: 4.0000 mg | Freq: Four times a day (QID) | INTRAMUSCULAR | Status: DC | PRN

## 2017-03-20 NOTE — Progress Notes (Signed)
   PRENATAL VISIT NOTE  Subjective:  Jessica Huffman is a 26 y.o. G2P0010 at [redacted]w[redacted]d being seen today for ongoing prenatal care.  She is currently monitored for the following issues for this high-risk pregnancy and has Supervision of high risk pregnancy, antepartum, second trimester; Methadone maintenance treatment affecting pregnancy (HCC); History of heroin abuse; Female genital lesion; and Hypertension affecting pregnancy in third trimester on her problem list.  Patient reports no complaints.  Contractions: Irregular. Vag. Bleeding: None.  Movement: Present. Denies leaking of fluid.   The following portions of the patient's history were reviewed and updated as appropriate: allergies, current medications, past family history, past medical history, past social history, past surgical history and problem list. Problem list updated.  Objective:   Vitals:   03/20/17 1302  BP: 131/85  Pulse: 84  Weight: 224 lb 12.8 oz (102 kg)    Fetal Status: Fetal Heart Rate (bpm): NST Fundal Height: 41 cm Movement: Present     General:  Alert, oriented and cooperative. Patient is in no acute distress.  Skin: Skin is warm and dry. No rash noted.   Cardiovascular: Normal heart rate noted  Respiratory: Normal respiratory effort, no problems with respiration noted  Abdomen: Soft, gravid, appropriate for gestational age.  Pain/Pressure: Present     Pelvic: Cervical exam deferred        Extremities: Normal range of motion.     Mental Status:  Normal mood and affect. Normal behavior. Normal judgment and thought content.   Assessment and Plan:  Pregnancy: G2P0010 at [redacted]w[redacted]d  1. Supervision of high risk pregnancy, antepartum, third trimester - AFI 26 today with two pockets greater than 8 cm-> plan for induction today. Orders placed. Discussed with Philipp Deputy, CNM.  2. Methadone maintenance treatment affecting pregnancy in third trimester [facility]) -continue current care  3. Post term pregnancy, antepartum condition  or complication -IOL today   [employer], DO

## 2017-03-20 NOTE — Anesthesia Pain Management Evaluation Note (Signed)
  [facility] Pain Management Visit Note  Patient: Jessica Huffman, 26 y.o., female  "Hello I am a member of the anesthesia team at [facility]. We have an anesthesia team available at all times to provide care throughout the hospital, including epidural management and anesthesia for C-section. I don't know your plan for the delivery whether it a natural birth, water birth, IV sedation, nitrous supplementation, doula or epidural, but we want to meet your pain goals."   1.Was your pain managed to your expectations on prior hospitalizations?   No prior hospitalizations  2.What is your expectation for pain management during this hospitalization?     Labor support without medications  3.How can we help you reach that goal? Pt desires natural childbirth but was open to discussion about all methods of pain control. All questions answered.  Record the patient's initial score and the patient's pain goal.   Pain: 0  Pain Goal: 10 [facility] wants you to be able to say your pain was always managed very well.  Moishe Schellenberg 03/20/2017

## 2017-03-20 NOTE — Progress Notes (Addendum)
OB Interim Progress Note  S: Patient and uncomfortable, but in NAD  O: BP 122/75 (BP Location: Left Arm)   Pulse 83   Temp 98.6 F (37 C)   Resp 12   Ht  (1.575 m)   Wt 101.6 kg (224 lb)   LMP 06/05/2016 (Approximate)   SpO2 100%   BMI 40.97 kg/m    Dilation: 2 Effacement (%): 20 Station: Ballotable Presentation: Vertex Exam by::  (Bowmaker- Kareem)  FHT: 135 BPM. Pos variability, pos accels, no decels Uterine activity: contractions q2-3 min  A/P: Foley bulb placed at 2000 and she is due for additional cytotec now. She is reporting continued contractions q2-3 min. She prefers to not receive the next cytotec dose because she is uncomfortable. I think that's fine. Will continue to watch and check for progress.  Continue expectant management Anticipate SVD  Renne Musca, MD 03/20/2017, 9:57 PM PGY-2

## 2017-03-20 NOTE — Patient Instructions (Signed)
Augmentation of Labor Augmentation of labor is when steps are taken to stimulate and strengthen uterine contractions during labor. This may be done when the contractions have slowed down or stopped, delaying progress of labor and delivery of the baby. Before beginning augmentation of labor, the health care provider will evaluate the condition of the mother and baby, the size and position of the baby, and the size of the birth canal. What are the reasons for labor augmentation? Reasons for augmentation of labor include:  Slow labor (prolonged first and second stage of labor) that has been associated with increased maternal risks, such as chorioamnionitis, postpartum hemorrhage, operative vaginal delivery, or third-degree or fourth-degree perineal lacerations.  Decreased average length of labor.  What methods are used for labor augmentation? Various methods may be used for augmentation of labor, including:  Oxytocin medicine. This medicine stimulates contractions. It is given through an IV access tube inserted into a vein.  Breaking the fluid-filled sac that surrounds the fetus (amniotic sac).  Stripping the membranes. The health care provider separates amniotic sac tissue from the cervix, causing the release of a hormone called progesterone that can stimulate uterine contractions.  Nipple stimulation.  Stimulation of certain pressure points on the ankles.  Manual or mechanical dilation of the cervix.  What are the risks associated with labor augmentation?  Overstimulation of the uterine contractions (continuous, prolonged, very strong contractions), causing fetal distress.  Increased chance of infection for the mother and baby.  Uterine tearing (rupture).  Breaking off (abruption) of the placenta.  Increased chance of cesarean, forceps, or vacuum delivery. What are some reasons for not doing labor augmentation? Augmentation of labor should not be done if:  The baby is too big for  the birth canal. This can be confirmed by ultrasonography.  The umbilical cord drops in front of the baby's head or breech part (prolapsed cord).  The mother had a previous cesarean delivery with a vertical incision in the uterus (or the kind of incision used is not known). High dose oxytocin should not be used if the mother had a previous cesarean delivery of any kind.  The mother had previous surgery on or into the uterus.  The mother has herpes.  The mother has cervical cancer.  The baby is lying sideways.  The mother's pelvis is deformed.  The mother is pregnant with more than two babies.  This information is not intended to replace advice given to you by your health care provider. Make sure you discuss any questions you have with your health care provider. Document Released: 11/21/2006 Document Revised: 11/10/2015 Document Reviewed: 12/26/2012 Elsevier Interactive Patient Education  2017 Elsevier Inc.  

## 2017-03-20 NOTE — Progress Notes (Signed)
Pt informed that the ultrasound is considered a limited OB ultrasound and is not intended to be a complete ultrasound exam.  Patient also informed that the ultrasound is not being completed with the intent of assessing for fetal or placental anomalies or any pelvic abnormalities.  Explained that the purpose of today's ultrasound is to assess for presentation, BPP and amniotic fluid volume.  Patient acknowledges the purpose of the exam and the limitations of the study.    IOL today due to polyhydramnios.

## 2017-03-20 NOTE — H&P (Signed)
Jessica Huffman is a 26 y.o. female G2P0010 @ 40.4wks presenting for Polyhydramnios. Denies any ctx, VB, or LOF. Has birth plan, requests intermittent monitoring and wanting to eat dinner.  Did complete water birth class has decided against it.    OB History    Gravida Para Term Preterm AB Living   2 0 0 0 1     SAB TAB Ectopic Multiple Live Births   0 1 0         Past Medical History:  Diagnosis Date  . Medical history non-contributory    Past Surgical History:  Procedure Laterality Date  . NO PAST SURGERIES     Family History: family history includes Cancer in her father. Social History:  reports that she has been smoking Cigarettes.  She has been smoking about 0.25 packs per day. She has never used smokeless tobacco. She reports that she uses drugs. She reports that she does not drink alcohol.  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     Maternal Diabetes: No Genetic Screening: Normal Maternal Ultrasounds/Referrals: Normal Fetal Ultrasounds or other Referrals:  Referred to [facility]  for methadone use Maternal Substance Abuse:  Yes:  Type: Methadone Significant Maternal Medications:  Meds include: Other: Methadone Significant Maternal Lab Results:  Lab values include: Group B Strep negative Other Comments:  Polyhydramnios, AFI 26 on 03/20/17  Review of Systems  All other systems reviewed and are negative.  Maternal Medical History:  Fetal activity: Perceived fetal activity is normal.   Last perceived fetal movement was within the past hour.    Prenatal complications: Polyhydramnios.   Prenatal Complications - Diabetes: none.    Dilation: 2 Effacement (%): thick Station: Ballotable Exam by::  (Bowmaker- Kareem) Blood pressure 121/85, pulse 91, resp. rate 16, height  (1.575 m), weight 101.6 kg (224 lb), last menstrual period 06/05/2016.  Membranes intact  Maternal Exam:  Uterine Assessment: Contraction frequency is  irregular.   Abdomen: Patient reports no abdominal tenderness. Fetal presentation: vertex  Introitus: Normal vulva. Normal vagina.  Cervix: Cervix evaluated by digital exam.     Fetal Exam Fetal Monitor Review: Baseline rate: 160s.  Variability: moderate (6-25 bpm).   Pattern: accelerations present and no decelerations.    Fetal State Assessment: Category I - tracings are normal.     Physical Exam  Nursing note and vitals reviewed. Constitutional: She is oriented to person, place, and time. She appears well-developed and well-nourished.  HENT:  Head: Normocephalic.  Eyes: Pupils are equal, round, and reactive to light.  Neck: Normal range of motion. Neck supple.  Cardiovascular: Normal rate, regular rhythm and normal heart sounds.   Respiratory: Effort normal and breath sounds normal.  GI: Soft.  Genitourinary: Vagina normal and uterus normal.  Genitourinary Comments: Gravid uterus  Musculoskeletal: Normal range of motion. She exhibits edema.  Trace edema  Neurological: She is alert and oriented to person, place, and time.  Skin: Skin is warm and dry.  Psychiatric: She has a normal mood and affect. Her behavior is normal.    Prenatal labs: ABO, Rh: --/--/A POS (10/09 1624) Antibody: NEG (10/09 1624) Rubella: immune RPR: Non Reactive (07/11 0858)  HBsAg: Negative (03/15 1058)  HIV:   neg  GBS:   neg  Assessment/Plan: IUP @ 40+4 Polyhydramnios IOL  Plan Admit for IOL with cytotec and pitocin Intermittent monitoring following cytotec protocol. Desires natural childbirth, may have IV pain meds and epidural if desires Consult physician  prn Anticipate SVD   Jessica Huffman, SNM 03/20/2017, 6:04 PM   CNM attestation:  I have seen and examined this patient; I agree with above documentation in the student nurse midwife's note.   Jessica Huffman is a 26 y.o. G2P0010 here for IOL due to polyhydramnios  PE: BP 125/89   Pulse 75   Temp 98.6 F (37 C)    Resp 12   Ht  (1.575 m)   Wt 101.6 kg (224 lb)   LMP 06/05/2016 (Approximate)   SpO2 100%   BMI 40.97 kg/m  Gen: calm comfortable, NAD Resp: normal effort, no distress Abd: gravid  ROS, labs, PMH reviewed  Plan: Admit to [employer] Cx ripening with cytotec/FB, followed by Pit prn Anticipate SVD  Jessica Huffman CNM 03/20/2017, 8:25 PM

## 2017-03-21 ENCOUNTER — Encounter (HOSPITAL_COMMUNITY): Payer: Self-pay | Admitting: Anesthesiology

## 2017-03-21 ENCOUNTER — Inpatient Hospital Stay (HOSPITAL_COMMUNITY): Payer: Medicaid Other | Admitting: Anesthesiology

## 2017-03-21 ENCOUNTER — Encounter (HOSPITAL_COMMUNITY)
Admission: AD | Disposition: A | Discharge status: Home / Self Care | Payer: Self-pay | Source: Ambulatory Visit | Attending: Obstetrics & Gynecology

## 2017-03-21 ENCOUNTER — Encounter: Payer: Medicaid Other | Admitting: Advanced Practice Midwife

## 2017-03-21 DIAGNOSIS — O403XX0 Polyhydramnios, third trimester, not applicable or unspecified: Secondary | ICD-10-CM

## 2017-03-21 DIAGNOSIS — Z3A40 40 weeks gestation of pregnancy: Secondary | ICD-10-CM

## 2017-03-21 LAB — RPR: RPR Ser Ql: NONREACTIVE

## 2017-03-21 SURGERY — Surgical Case
Anesthesia: Epidural

## 2017-03-21 MED ORDER — LACTATED RINGERS IV SOLN
INTRAVENOUS | Status: DC
  Administered 2017-03-21: 15:00:00 via INTRAUTERINE

## 2017-03-21 MED ORDER — PHENYLEPHRINE 40 MCG/ML (10ML) SYRINGE FOR IV PUSH (FOR BLOOD PRESSURE SUPPORT)
80.0000 ug | PREFILLED_SYRINGE | INTRAVENOUS | Status: DC | PRN
  Filled 2017-03-21: qty 10

## 2017-03-21 MED ORDER — LACTATED RINGERS IV SOLN
500.0000 mL | Freq: Once | INTRAVENOUS | Status: AC
  Administered 2017-03-21: 500 mL via INTRAVENOUS

## 2017-03-21 MED ORDER — SODIUM BICARBONATE 8.4 % IV SOLN
INTRAVENOUS | Status: DC | PRN
  Administered 2017-03-21: 1 mL via EPIDURAL
  Administered 2017-03-21 (×3): 5 mL via EPIDURAL
  Administered 2017-03-21: 4 mL via EPIDURAL

## 2017-03-21 MED ORDER — FENTANYL 2.5 MCG/ML BUPIVACAINE 1/10 % EPIDURAL INFUSION (WH - ANES)
14.0000 mL/h | INTRAMUSCULAR | Status: DC | PRN
  Administered 2017-03-21 (×3): 14 mL/h via EPIDURAL
  Filled 2017-03-21 (×3): qty 100

## 2017-03-21 MED ORDER — PHENYLEPHRINE 8 MG IN D5W 100 ML (0.08MG/ML) PREMIX OPTIME
INJECTION | INTRAVENOUS | Status: AC
  Filled 2017-03-21: qty 100

## 2017-03-21 MED ORDER — DEXAMETHASONE SODIUM PHOSPHATE 4 MG/ML IJ SOLN
INTRAMUSCULAR | Status: DC | PRN
  Administered 2017-03-21: 4 mg via INTRAVENOUS

## 2017-03-21 MED ORDER — FENTANYL CITRATE (PF) 100 MCG/2ML IJ SOLN
INTRAMUSCULAR | Status: AC
  Filled 2017-03-21: qty 2

## 2017-03-21 MED ORDER — TERBUTALINE SULFATE 1 MG/ML IJ SOLN: 0.2500 mg | Freq: Once | INTRAMUSCULAR | Status: DC | PRN

## 2017-03-21 MED ORDER — OXYTOCIN 10 UNIT/ML IJ SOLN
INTRAMUSCULAR | Status: AC
  Filled 2017-03-21: qty 4

## 2017-03-21 MED ORDER — PHENYLEPHRINE 40 MCG/ML (10ML) SYRINGE FOR IV PUSH (FOR BLOOD PRESSURE SUPPORT): 80.0000 ug | PREFILLED_SYRINGE | INTRAVENOUS | Status: DC | PRN

## 2017-03-21 MED ORDER — ONDANSETRON HCL 4 MG/2ML IJ SOLN
INTRAMUSCULAR | Status: DC | PRN
  Administered 2017-03-21: 4 mg via INTRAVENOUS

## 2017-03-21 MED ORDER — BUPIVACAINE IN DEXTROSE 0.75-8.25 % IT SOLN
INTRATHECAL | Status: AC
  Filled 2017-03-21: qty 2

## 2017-03-21 MED ORDER — CEFAZOLIN SODIUM-DEXTROSE 2-3 GM-% IV SOLR
INTRAVENOUS | Status: DC | PRN
  Administered 2017-03-21: 2 g via INTRAVENOUS

## 2017-03-21 MED ORDER — DIPHENHYDRAMINE HCL 50 MG/ML IJ SOLN: 12.5000 mg | INTRAMUSCULAR | Status: DC | PRN

## 2017-03-21 MED ORDER — MORPHINE SULFATE (PF) 0.5 MG/ML IJ SOLN
INTRAMUSCULAR | Status: AC
  Filled 2017-03-21: qty 10

## 2017-03-21 MED ORDER — ONDANSETRON HCL 4 MG/2ML IJ SOLN
INTRAMUSCULAR | Status: AC
  Filled 2017-03-21: qty 2

## 2017-03-21 MED ORDER — PHENYLEPHRINE 40 MCG/ML (10ML) SYRINGE FOR IV PUSH (FOR BLOOD PRESSURE SUPPORT)
PREFILLED_SYRINGE | INTRAVENOUS | Status: AC
  Filled 2017-03-21: qty 10

## 2017-03-21 MED ORDER — EPHEDRINE 5 MG/ML INJ: 10.0000 mg | INTRAVENOUS | Status: DC | PRN

## 2017-03-21 MED ORDER — OXYTOCIN 40 UNITS IN LACTATED RINGERS INFUSION - SIMPLE MED
1.0000 m[IU]/min | INTRAVENOUS | Status: DC
  Administered 2017-03-21: 2 m[IU]/min via INTRAVENOUS
  Filled 2017-03-21: qty 1000

## 2017-03-21 MED ORDER — LIDOCAINE HCL (PF) 1 % IJ SOLN
INTRAMUSCULAR | Status: DC | PRN
  Administered 2017-03-21 (×3): 4 mL via EPIDURAL

## 2017-03-21 MED ORDER — CEFAZOLIN SODIUM-DEXTROSE 2-4 GM/100ML-% IV SOLN
INTRAVENOUS | Status: AC
  Filled 2017-03-21: qty 100

## 2017-03-21 MED ORDER — DEXTROSE 5 % IV SOLN
500.0000 mg | Freq: Once | INTRAVENOUS | Status: AC
  Administered 2017-03-21: 500 mg via INTRAVENOUS
  Filled 2017-03-21: qty 500

## 2017-03-21 MED ORDER — PHENYLEPHRINE HCL 10 MG/ML IJ SOLN
INTRAMUSCULAR | Status: DC | PRN
  Administered 2017-03-21 – 2017-03-22 (×4): 80 ug via INTRAVENOUS

## 2017-03-21 SURGICAL SUPPLY — 42 items
ADH SKN CLS APL DERMABOND .7 (GAUZE/BANDAGES/DRESSINGS) ×2
ADH SKN CLS LQ APL DERMABOND (GAUZE/BANDAGES/DRESSINGS) ×1
CHLORAPREP W/TINT 26ML (MISCELLANEOUS) ×6 IMPLANT
CLAMP CORD UMBIL (MISCELLANEOUS) IMPLANT
CLOTH BEACON ORANGE TIMEOUT ST (SAFETY) ×3 IMPLANT
DERMABOND ADHESIVE PROPEN (GAUZE/BANDAGES/DRESSINGS) ×2
DERMABOND ADVANCED (GAUZE/BANDAGES/DRESSINGS) ×4
DERMABOND ADVANCED .7 DNX12 (GAUZE/BANDAGES/DRESSINGS) ×2 IMPLANT
DERMABOND ADVANCED .7 DNX6 (GAUZE/BANDAGES/DRESSINGS) IMPLANT
DRSG OPSITE POSTOP 4X10 (GAUZE/BANDAGES/DRESSINGS) ×3 IMPLANT
ELECT REM PT RETURN 9FT ADLT (ELECTROSURGICAL) ×3
ELECTRODE REM PT RTRN 9FT ADLT (ELECTROSURGICAL) ×1 IMPLANT
EXTRACTOR VACUUM BELL STYLE (SUCTIONS) ×2 IMPLANT
GLOVE BIOGEL PI IND STRL 7.0 (GLOVE) ×1 IMPLANT
GLOVE BIOGEL PI IND STRL 8 (GLOVE) ×1 IMPLANT
GLOVE BIOGEL PI INDICATOR 7.0 (GLOVE) ×2
GLOVE BIOGEL PI INDICATOR 8 (GLOVE) ×2
GLOVE ECLIPSE 8.0 STRL XLNG CF (GLOVE) ×3 IMPLANT
GOWN STRL REUS W/TWL LRG LVL3 (GOWN DISPOSABLE) ×6 IMPLANT
KIT ABG SYR 3ML LUER SLIP (SYRINGE) ×3 IMPLANT
NDL HYPO 18GX1.5 BLUNT FILL (NEEDLE) ×1 IMPLANT
NDL HYPO 25X5/8 SAFETYGLIDE (NEEDLE) ×1 IMPLANT
NEEDLE HYPO 18GX1.5 BLUNT FILL (NEEDLE) ×3 IMPLANT
NEEDLE HYPO 22GX1.5 SAFETY (NEEDLE) ×3 IMPLANT
NEEDLE HYPO 25X5/8 SAFETYGLIDE (NEEDLE) ×3 IMPLANT
NS IRRIG 1000ML POUR BTL (IV SOLUTION) ×3 IMPLANT
PACK C SECTION WH (CUSTOM PROCEDURE TRAY) ×3 IMPLANT
PAD OB MATERNITY 4.3X12.25 (PERSONAL CARE ITEMS) ×3 IMPLANT
PENCIL SMOKE EVAC W/HOLSTER (ELECTROSURGICAL) ×3 IMPLANT
RTRCTR C-SECT PINK 25CM LRG (MISCELLANEOUS) IMPLANT
SUT CHROMIC 0 CT 1 (SUTURE) ×3 IMPLANT
SUT MNCRL 0 VIOLET CTX 36 (SUTURE) ×2 IMPLANT
SUT MONOCRYL 0 CTX 36 (SUTURE) ×6
SUT PLAIN 2 0 (SUTURE)
SUT PLAIN 2 0 XLH (SUTURE) ×2 IMPLANT
SUT PLAIN ABS 2-0 CT1 27XMFL (SUTURE) IMPLANT
SUT VIC AB 0 CTX 36 (SUTURE) ×6
SUT VIC AB 0 CTX36XBRD ANBCTRL (SUTURE) ×1 IMPLANT
SUT VIC AB 4-0 KS 27 (SUTURE) IMPLANT
SYR 20CC LL (SYRINGE) ×6 IMPLANT
TOWEL OR 17X24 6PK STRL BLUE (TOWEL DISPOSABLE) ×3 IMPLANT
TRAY FOLEY BAG SILVER LF 14FR (SET/KITS/TRAYS/PACK) IMPLANT

## 2017-03-21 NOTE — Progress Notes (Signed)
OB Interim Progress Note  S: Epidural placed, patient is comfortable  O: BP 116/72   Pulse 76   Temp 98.1 F (36.7 C) (Oral)   Resp 16   Ht  (1.575 m)   Wt 101.6 kg (224 lb)   LMP 06/05/2016 (Approximate)   SpO2 97%   BMI 40.97 kg/m    Dilation: 2 Effacement (%): 20 Station: Ballotable Presentation: Vertex Exam by::  (Bowmaker- Kareem)  FHT: 135 BPM, mod variability, pos accels, no decels Uterine activity: q2.5-30min   A/P: Patient is comfortable and FHT reassuring. Will start pit once foley bulb is out.  Continue expectant management Anticipate SVD  Renne Musca, MD 03/21/2017, 4:35 AM PGY-2

## 2017-03-21 NOTE — Progress Notes (Signed)
Labor Progress Note Jessica Huffman is a 26 y.o. G2P0010 at [redacted]w[redacted]d presented for here for IOL 2/2 postdates and polyhydraminos. S: No complains  O:  BP 118/77   Pulse 88   Temp 98.2 F (36.8 C) (Oral)   Resp 18   Ht  (1.575 m)   Wt 224 lb (101.6 kg)   LMP 06/05/2016 (Approximate)   SpO2 100%   BMI 40.97 kg/m  EFM: 150/mod var/late decels/ pos acels  CVE: Dilation: 7.5 Effacement (%): 70 Station: -1 Presentation: Vertex Exam by:: [name] RN    A&P: 26 y.o. G2P0010 [redacted]w[redacted]d here for postdates and polyhydraminos. #Labor: AROM of forebag , clear fluid. IUPC placed. #Pain: epidural #FWB: cat 2 #GBS negative # methadone use during pregnancy- consult placed through NICU  [employer], DO 12:59 PM

## 2017-03-21 NOTE — Progress Notes (Signed)
Patient ID: BRITTNYE JOSEPHS, female   DOB: [birth year], 26 y.o.   MRN: [id number] MESA JANUS is a 26 y.o. G2P0010 at [redacted]w[redacted]d admitted for induction of labor due to polyhydramnios.  Subjective: Comfortable w/ epidural  Objective: BP 116/72   Pulse 76   Temp 98.1 F (36.7 C) (Oral)   Resp 16   Ht  (1.575 m)   Wt 101.6 kg (224 lb)   LMP 06/05/2016 (Approximate)   SpO2 97%   BMI 40.97 kg/m  No intake/output data recorded.  FHT:  FHR: 135 bpm, variability: moderate,  accelerations:  Present,  decelerations:  Absent UC:   irregular  SVE:   Dilation: 5 Effacement (%): 80 Station: Ballotable Exam by:: Shawna Clamp, CNM  Foley bulb out  Labs: Lab Results  Component Value Date   WBC 6.2 03/20/2017   HGB 11.8 (L) 03/20/2017   HCT 34.7 (L) 03/20/2017   MCV 87.8 03/20/2017   PLT 223 03/20/2017    Assessment / Plan: IOL d/t polyhydramnios, foley bulb out, start pitocin per protocol  Labor: s/p ripening Fetal Wellbeing:  Category I Pain Control:  Epidural Pre-eclampsia: n/a I/D:  n/a Anticipated MOD:  NSVD  Marge Duncans CNM, WHNP-BC 03/21/2017, 6:21 AM

## 2017-03-21 NOTE — Anesthesia Preprocedure Evaluation (Addendum)
Anesthesia Evaluation  Patient identified by MRN, date of birth, ID band Patient awake    Reviewed: Allergy & Precautions, H&P , NPO status , Patient's Chart, lab work & pertinent test results  History of Anesthesia Complications Negative for: history of anesthetic complications  Airway Mallampati: II  TM Distance: >3 FB Neck ROM: full    Dental no notable dental hx. (+) Teeth Intact   Pulmonary Current Smoker,    Pulmonary exam normal breath sounds clear to auscultation       Cardiovascular hypertension, negative cardio ROS Normal cardiovascular exam Rhythm:regular Rate:Normal     Neuro/Psych negative neurological ROS  negative psych ROS   GI/Hepatic negative GI ROS, (+)     substance abuse  , On Methadone maintenance   Endo/Other  Morbid obesity  Renal/GU negative Renal ROS  negative genitourinary   Musculoskeletal   Abdominal (+) + obese,   Peds  Hematology  (+) anemia ,   Anesthesia Other Findings Methadone  Reproductive/Obstetrics (+) Pregnancy                            Anesthesia Physical Anesthesia Plan  ASA: III and emergent  Anesthesia Plan: Epidural   Post-op Pain Management:    Induction:   PONV Risk Score and Plan: 4 or greater and Ondansetron, Dexamethasone, Midazolam, Scopolamine patch - Pre-op and Propofol infusion  Airway Management Planned: Natural Airway  Additional Equipment:   Intra-op Plan:   Post-operative Plan:   Informed Consent: I have reviewed the patients History and Physical, chart, labs and discussed the procedure including the risks, benefits and alternatives for the proposed anesthesia with the patient or authorized representative who has indicated his/her understanding and acceptance.   Dental advisory given  Plan Discussed with: Anesthesiologist, CRNA and Surgeon  Anesthesia Plan Comments: (Patient for C/section for arrest of  dilation. Will use epidural for C/Section. M. Malen Gauze, MD)       Anesthesia Quick Evaluation

## 2017-03-21 NOTE — Progress Notes (Signed)
Labor Progress Note Jessica Huffman is a 26 y.o. G2P0010 at [redacted]w[redacted]d presented for IOL 2/2 poly and postdates S:   O:  BP (!) 124/92   Pulse 90   Temp 97.8 F (36.6 C) (Oral)   Resp 18   Ht  (1.575 m)   Wt 224 lb (101.6 kg)   LMP 06/05/2016 (Approximate)   SpO2 100%   BMI 40.97 kg/m    CVE: Dilation: 7 Effacement (%): 70 Station: 0 Presentation: Vertex Exam by:: Ophie Burrowes MD    A&P: 26 y.o. G2P0010 [redacted]w[redacted]d here for IOL for poly and postdates #Labor: Has  Not progressed. Patient is discouraged, but pitocin is only at 6 mu/min. Was previously at 14. Continue to titrate pitocin. Anticipate SVD. Patient in agreement with this plan. Discussed plan with Dr. Despina Hidden.  Rolm Bookbinder, DO 9:10 PM

## 2017-03-21 NOTE — Anesthesia Procedure Notes (Signed)
Epidural Patient location during procedure: OB Start time: 03/21/2017 12:42 AM  Staffing Anesthesiologist: Karna Christmas P Performed: anesthesiologist   Preanesthetic Checklist Completed: patient identified, site marked, pre-op evaluation, timeout performed, IV checked, risks and benefits discussed and monitors and equipment checked  Epidural Patient position: sitting Prep: DuraPrep Patient monitoring: heart rate, cardiac monitor, continuous pulse ox and blood pressure Approach: midline Location: L3-L4 Injection technique: LOR air  Needle:  Needle type: Tuohy  Needle gauge: 17 G Needle length: 9 cm Needle insertion depth: 6 cm Catheter type: closed end flexible Catheter size: 19 Gauge Catheter at skin depth: 11 cm Test dose: negative and Other  Assessment Events: blood not aspirated, injection not painful, no injection resistance and negative IV test  Additional Notes Informed consent obtained prior to proceeding including risk of failure, 1% risk of PDPH, risk of minor discomfort and bruising.  Discussed rare but serious complications.  Discussed alternatives to epidural analgesia and patient desires to proceed.  Timeout performed pre-procedure verifying patient name, procedure, and platelet count.  Patient tolerated procedure well. Reason for block:procedure for pain

## 2017-03-21 NOTE — Progress Notes (Signed)
Evaluated patient at bedside. EFM shows 150 bpm, mod var, recurrent late decels, pos acels. Nurse turned off pitocin due to recurrent late variable decelerations. Patient's cervix is unchanged since this morning despite adequate contractions measured by IUPC.   A/P: 1. Failure to dilate at active phase of labor- plan for c-section. Discussed risks and benefits with patient. All questions answered. Dr. Despina Hidden in agreement with plan.  Rolm Bookbinder, DO

## 2017-03-21 NOTE — Op Note (Signed)
Jessica Huffman PROCEDURE DATE: 03/21/2017  PREOPERATIVE DIAGNOSES: Intrauterine pregnancy at [redacted]w[redacted]d weeks gestation; failure to progress: arrest of dilation  POSTOPERATIVE DIAGNOSES: The same  PROCEDURE: Primary  Low Transverse Cesarean Section  SURGEON:  Dr. Duane Lope  ASSISTANT:  Dr. Rolm Bookbinder  ANESTHESIOLOGY TEAM: Anesthesiologist: Leonides Grills, MD; Mal Amabile, MD; Bethena Midget, MD CRNA: Rhymer, Doree Fudge, CRNA  INDICATIONS: Jessica Huffman is a 26 y.o. G2P0010 at [redacted]w[redacted]d here for cesarean section secondary to the indications listed under preoperative diagnoses; please see preoperative note for further details.  The risks of cesarean section were discussed with the patient including but were not limited to: bleeding which may require transfusion or reoperation; infection which may require antibiotics; injury to bowel, bladder, ureters or other surrounding organs; injury to the fetus; need for additional procedures including hysterectomy in the event of a life-threatening hemorrhage; placental abnormalities wth subsequent pregnancies, incisional problems, thromboembolic phenomenon and other postoperative/anesthesia complications.   The patient concurred with the proposed plan, giving informed written consent for the procedure.    FINDINGS:  Viable female infant in cephalic presentation.  Apgars 9 and 9.  Clear amniotic fluid.  Intact placenta, three vessel cord.  Normal uterus, fallopian tubes and ovaries bilaterally. Cord gas pending.  ANESTHESIA: Epidural  ESTIMATED BLOOD LOSS: 700 ml SPECIMENS: Placenta sent to pathology COMPLICATIONS: None immediate  PROCEDURE IN DETAIL:  The patient preoperatively received intravenous antibiotics and had sequential compression devices applied to her lower extremities.  She was then taken to the operating room where the epidural anesthesia was dosed up to surgical level and was found to be adequate. She was then placed in a dorsal supine position  with a leftward tilt, and prepped and draped in a sterile manner.  A foley catheter was placed previously due to epidural into her bladder and attached to constant gravity.  After an adequate timeout was performed, a Pfannenstiel skin incision was made with scalpel and carried through to the underlying layer of fascia. The fascia was incised in the midline, and this incision was extended bilaterally using the Mayo scissors.  Kocher clamps were applied to the superior aspect of the fascial incision and the underlying rectus muscles were dissected off bluntly.  A similar process was carried out on the inferior aspect of the fascial incision. The rectus muscles were separated in the midline bluntly and the peritoneum was entered bluntly. Attention was turned to the lower uterine segment where a low transverse hysterotomy was made with a scalpel and extended bilaterally bluntly.  The infant was successfully delivered, the cord was clamped and cut after one minute, and the infant was handed over to the awaiting neonatology team. Uterine massage was then administered, and the placenta delivered intact with a three-vessel cord. The uterus was then cleared of clots and debris.  The hysterotomy was closed with 0 Monocryl in a running locked fashion, and an imbricating layer was also placed with 0 Monocryl.  The pelvis was cleared of all clot and debris. Hemostasis was confirmed on all surfaces.  The peritoneum was closed with a 0 Vicryl running stitch and the rectus muscles were reapproximated using 0 Vicryl interrupted stitches. The fascia was then closed using 0 Vicryl  in a running fashion.  The subcutaneous layer was irrigated. The skin was closed with a 4-0 Vicryl subcuticular stitch. The patient tolerated the procedure well. Sponge, lap, instrument and needle counts were correct x 3.  She was taken to the recovery room in stable  condition.    Rolm Bookbinder, DO [place] Fellow

## 2017-03-21 NOTE — Progress Notes (Signed)
Jessica Huffman is a 26 y.o. G2P0010 at [redacted]w[redacted]d by ultrasound admitted for induction of labor due to polyhydraminos.  Subjective: Patient doing well, no complaints, pain controlled with epidural  Objective: BP (!) 135/98   Pulse 94   Temp 98.3 F (36.8 C) (Oral)   Resp 16   Ht  (1.575 m)   Wt 101.6 kg (224 lb)   LMP 06/05/2016 (Approximate)   SpO2 100%   BMI 40.97 kg/m  No intake/output data recorded. Total I/O In: -  Out: 1350 [Urine:1350]  FHT:  FHR: 130 bpm, variability: moderate,  accelerations:  Present,  decelerations:  Absent UC:   regular, every 2-3 minutes SVE:   Dilation: 7 Effacement (%): 70 Station: -2 Exam by:: Dr. Alvester Morin   Labs: Lab Results  Component Value Date   WBC 6.2 03/20/2017   HGB 11.8 (L) 03/20/2017   HCT 34.7 (L) 03/20/2017   MCV 87.8 03/20/2017   PLT 223 03/20/2017    Assessment / Plan: Induction of labor due to polyhydraminos and postdates ,  progressing well on pitocin  Labor: progressing on pitocin, will continue to monitor Preeclampsia:  no signs or symptoms of toxicity Fetal Wellbeing:  Category I Pain Control:  Epidural I/D:  n/a Anticipated MOD:  NSVD  Dyke Maes Tannen Vandezande, PA-S 03/21/2017, 10:52 AM

## 2017-03-21 NOTE — Progress Notes (Signed)
Jessica Huffman is a 26 y.o. G2P0010 at [redacted]w[redacted]d by ultrasound admitted for induction of labor due to polyhydramnios and post dates  .  Subjective: Patient doing well, no complaints. Feeling intermittent contractions, some left sided abdominal pressure.   Objective: BP 120/83   Pulse 85   Temp 97.8 F (36.6 C) (Oral)   Resp 15   Ht  (1.575 m)   Wt 101.6 kg (224 lb)   LMP 06/05/2016 (Approximate)   SpO2 100%   BMI 40.97 kg/m  I/O last 3 completed shifts: In: -  Out: 2050 [Urine:2050] Total I/O In: -  Out: 1400 [Urine:1400]  FHT:  FHR: 135 bpm, variability: minimal ,  accelerations:  Present,  decelerations:  Absent UC:   regular, every 2-3 minutes SVE:   Dilation: 7 Effacement (%): 70 Station: 0 Exam by:: Malva Cogan RN   Labs: Lab Results  Component Value Date   WBC 6.2 03/20/2017   HGB 11.8 (L) 03/20/2017   HCT 34.7 (L) 03/20/2017   MCV 87.8 03/20/2017   PLT 223 03/20/2017    Assessment / Plan: Induction of labor due to polyhydraminos,  progressing well on pitocin  Labor: progressing on pitocin (current dose 9 ml-units/min) Preeclampsia:  no signs or symptoms of toxicity Fetal Wellbeing:  Category II Pain Control:  Epidural I/D:  GBS negative Anticipated MOD:  Vaginal delivery  Methadone use during pregnancy   Dyke Maes Chantae Soo 03/21/2017, 7:49 PM

## 2017-03-21 NOTE — Consult Note (Signed)
NAS consult requested by Dr. Manus Rudd.  Met with mother in Room 165 to complete consult.  Mother is receiving 100 mg methadone per day from [facility] for medication assisted treatment.  The services that mother received during pregnancy were MAT at [facility] and a prior NICU consult around 33-[redacted] weeks gestation.  She states that of these services all were beneficial during her pregnancy; she is drug tested 1-2 times per month and other than cigarette smoking she has not used anything other than methadone.  During this consult, signs and symptoms of NAS were reviewed with mother. She is aware that the postnatal observation will be ultimately be determined by her pediatrician but is prepared for an extended postpartum stay (5-7 days) for her baby.  NAS treatment was reviewed with emphasis on mother's role in non-pharmacologic therapies including eat, sleep, console theory of management.  The possibility for medication management of NAS was discussed and mother is aware infant will be transferred to NICU if this need arises. All questions were answered at that time and NICU consult team contact information was left with mother.  Please call if there are further needs.

## 2017-03-22 ENCOUNTER — Encounter (HOSPITAL_COMMUNITY): Payer: Self-pay | Admitting: Behavioral Health

## 2017-03-22 DIAGNOSIS — Z98891 History of uterine scar from previous surgery: Secondary | ICD-10-CM

## 2017-03-22 LAB — CBC
HCT: 30.2 % — ABNORMAL LOW (ref 36.0–46.0)
Hemoglobin: 10.2 g/dL — ABNORMAL LOW (ref 12.0–15.0)
MCH: 29.8 pg (ref 26.0–34.0)
MCHC: 33.8 g/dL (ref 30.0–36.0)
MCV: 88.3 fL (ref 78.0–100.0)
PLATELETS: 157 10*3/uL (ref 150–400)
RBC: 3.42 MIL/uL — AB (ref 3.87–5.11)
RDW: 13.6 % (ref 11.5–15.5)
WBC: 17.6 10*3/uL — AB (ref 4.0–10.5)

## 2017-03-22 MED ORDER — MIDAZOLAM HCL 2 MG/2ML IJ SOLN
INTRAMUSCULAR | Status: AC
  Filled 2017-03-22: qty 2

## 2017-03-22 MED ORDER — NALBUPHINE HCL 10 MG/ML IJ SOLN: 5.0000 mg | Freq: Once | INTRAMUSCULAR | Status: DC | PRN

## 2017-03-22 MED ORDER — DIPHENHYDRAMINE HCL 50 MG/ML IJ SOLN: 12.5000 mg | INTRAMUSCULAR | Status: DC | PRN

## 2017-03-22 MED ORDER — NITROGLYCERIN 0.4 MG/SPRAY TL SOLN
Status: AC
Start: 2017-03-22 — End: 2017-03-22
  Filled 2017-03-22: qty 4.9

## 2017-03-22 MED ORDER — NALBUPHINE HCL 10 MG/ML IJ SOLN: 5.0000 mg | INTRAMUSCULAR | Status: DC | PRN

## 2017-03-22 MED ORDER — MEPERIDINE HCL 25 MG/ML IJ SOLN: 6.2500 mg | INTRAMUSCULAR | Status: DC | PRN

## 2017-03-22 MED ORDER — METHYLERGONOVINE MALEATE 0.2 MG PO TABS: 0.2000 mg | ORAL_TABLET | ORAL | Status: DC | PRN

## 2017-03-22 MED ORDER — KETOROLAC TROMETHAMINE 30 MG/ML IJ SOLN
30.0000 mg | Freq: Three times a day (TID) | INTRAMUSCULAR | Status: DC
  Administered 2017-03-22: 30 mg via INTRAMUSCULAR
  Filled 2017-03-22: qty 1

## 2017-03-22 MED ORDER — FENTANYL CITRATE (PF) 100 MCG/2ML IJ SOLN: 25.0000 ug | INTRAMUSCULAR | Status: DC | PRN

## 2017-03-22 MED ORDER — LACTATED RINGERS IV SOLN: INTRAVENOUS | Status: DC

## 2017-03-22 MED ORDER — METOCLOPRAMIDE HCL 5 MG/ML IJ SOLN: 10.0000 mg | Freq: Once | INTRAMUSCULAR | Status: DC | PRN

## 2017-03-22 MED ORDER — OXYCODONE-ACETAMINOPHEN 5-325 MG PO TABS: 2.0000 | ORAL_TABLET | ORAL | Status: DC | PRN

## 2017-03-22 MED ORDER — OXYTOCIN 40 UNITS IN LACTATED RINGERS INFUSION - SIMPLE MED: 2.5000 [IU]/h | INTRAVENOUS | Status: AC

## 2017-03-22 MED ORDER — SENNOSIDES-DOCUSATE SODIUM 8.6-50 MG PO TABS
2.0000 | ORAL_TABLET | ORAL | Status: DC
  Administered 2017-03-22 – 2017-03-23 (×2): 2 via ORAL
  Filled 2017-03-22 (×2): qty 2

## 2017-03-22 MED ORDER — MORPHINE SULFATE (PF) 0.5 MG/ML IJ SOLN
INTRAMUSCULAR | Status: DC | PRN
  Administered 2017-03-21: 4 mg via EPIDURAL

## 2017-03-22 MED ORDER — TETANUS-DIPHTH-ACELL PERTUSSIS 5-2.5-18.5 LF-MCG/0.5 IM SUSP: 0.5000 mL | Freq: Once | INTRAMUSCULAR | Status: DC

## 2017-03-22 MED ORDER — SIMETHICONE 80 MG PO CHEW
80.0000 mg | CHEWABLE_TABLET | ORAL | Status: DC
  Administered 2017-03-23: 80 mg via ORAL
  Filled 2017-03-22: qty 1

## 2017-03-22 MED ORDER — PRENATAL MULTIVITAMIN CH
1.0000 | ORAL_TABLET | Freq: Every day | ORAL | Status: DC
  Administered 2017-03-22 – 2017-03-24 (×3): 1 via ORAL
  Filled 2017-03-22 (×3): qty 1

## 2017-03-22 MED ORDER — NALOXONE HCL 0.4 MG/ML IJ SOLN: 0.4000 mg | INTRAMUSCULAR | Status: DC | PRN

## 2017-03-22 MED ORDER — KETOROLAC TROMETHAMINE 30 MG/ML IJ SOLN: 30.0000 mg | Freq: Four times a day (QID) | INTRAMUSCULAR | Status: DC | PRN

## 2017-03-22 MED ORDER — OXYCODONE-ACETAMINOPHEN 5-325 MG PO TABS
1.0000 | ORAL_TABLET | ORAL | Status: DC | PRN
  Administered 2017-03-23 – 2017-03-24 (×4): 1 via ORAL
  Filled 2017-03-22 (×5): qty 1

## 2017-03-22 MED ORDER — METHYLERGONOVINE MALEATE 0.2 MG/ML IJ SOLN: 0.2000 mg | INTRAMUSCULAR | Status: DC | PRN

## 2017-03-22 MED ORDER — MENTHOL 3 MG MT LOZG: 1.0000 | LOZENGE | OROMUCOSAL | Status: DC | PRN

## 2017-03-22 MED ORDER — IBUPROFEN 600 MG PO TABS: 600.0000 mg | ORAL_TABLET | Freq: Four times a day (QID) | ORAL | Status: DC

## 2017-03-22 MED ORDER — NALOXONE HCL 2 MG/2ML IJ SOSY
1.0000 ug/kg/h | PREFILLED_SYRINGE | INTRAMUSCULAR | Status: DC | PRN
  Filled 2017-03-22: qty 2

## 2017-03-22 MED ORDER — SIMETHICONE 80 MG PO CHEW
80.0000 mg | CHEWABLE_TABLET | Freq: Three times a day (TID) | ORAL | Status: DC
  Administered 2017-03-22 – 2017-03-24 (×7): 80 mg via ORAL
  Filled 2017-03-22 (×8): qty 1

## 2017-03-22 MED ORDER — KETOROLAC TROMETHAMINE 30 MG/ML IJ SOLN
30.0000 mg | Freq: Four times a day (QID) | INTRAMUSCULAR | Status: DC
  Administered 2017-03-22 (×3): 30 mg via INTRAVENOUS
  Filled 2017-03-22 (×3): qty 1

## 2017-03-22 MED ORDER — METHADONE HCL 10 MG/ML PO CONC
100.0000 mg | ORAL | Status: DC
  Administered 2017-03-22 – 2017-03-24 (×3): 100 mg via ORAL
  Filled 2017-03-22 (×4): qty 10

## 2017-03-22 MED ORDER — COCONUT OIL OIL
1.0000 "application " | TOPICAL_OIL | Status: DC | PRN
  Administered 2017-03-24: 1 via TOPICAL
  Filled 2017-03-22: qty 120

## 2017-03-22 MED ORDER — WITCH HAZEL-GLYCERIN EX PADS: 1.0000 "application " | MEDICATED_PAD | CUTANEOUS | Status: DC | PRN

## 2017-03-22 MED ORDER — ZOLPIDEM TARTRATE 5 MG PO TABS: 5.0000 mg | ORAL_TABLET | Freq: Every evening | ORAL | Status: DC | PRN

## 2017-03-22 MED ORDER — FENTANYL CITRATE (PF) 100 MCG/2ML IJ SOLN
INTRAMUSCULAR | Status: DC | PRN
  Administered 2017-03-21: 100 ug via INTRAVENOUS

## 2017-03-22 MED ORDER — DIPHENHYDRAMINE HCL 25 MG PO CAPS: 25.0000 mg | ORAL_CAPSULE | Freq: Four times a day (QID) | ORAL | Status: DC | PRN

## 2017-03-22 MED ORDER — DIPHENHYDRAMINE HCL 25 MG PO CAPS: 25.0000 mg | ORAL_CAPSULE | ORAL | Status: DC | PRN

## 2017-03-22 MED ORDER — SCOPOLAMINE 1 MG/3DAYS TD PT72
MEDICATED_PATCH | TRANSDERMAL | Status: DC | PRN
  Administered 2017-03-21: 1 via TRANSDERMAL

## 2017-03-22 MED ORDER — KETOROLAC TROMETHAMINE 30 MG/ML IJ SOLN
INTRAMUSCULAR | Status: AC
  Administered 2017-03-22: 30 mg via INTRAVENOUS
  Filled 2017-03-22: qty 1

## 2017-03-22 MED ORDER — DIBUCAINE 1 % RE OINT: 1.0000 "application " | TOPICAL_OINTMENT | RECTAL | Status: DC | PRN

## 2017-03-22 MED ORDER — MIDAZOLAM HCL 5 MG/5ML IJ SOLN
INTRAMUSCULAR | Status: DC | PRN
  Administered 2017-03-22: 1 mg via INTRAVENOUS

## 2017-03-22 MED ORDER — SIMETHICONE 80 MG PO CHEW
80.0000 mg | CHEWABLE_TABLET | ORAL | Status: DC | PRN
  Administered 2017-03-22: 80 mg via ORAL

## 2017-03-22 MED ORDER — ACETAMINOPHEN 325 MG PO TABS: 650.0000 mg | ORAL_TABLET | ORAL | Status: DC | PRN

## 2017-03-22 MED ORDER — OXYTOCIN 10 UNIT/ML IJ SOLN
INTRAVENOUS | Status: DC | PRN
  Administered 2017-03-21: 40 [IU] via INTRAVENOUS

## 2017-03-22 MED ORDER — SODIUM CHLORIDE 0.9% FLUSH: 3.0000 mL | INTRAVENOUS | Status: DC | PRN

## 2017-03-22 MED ORDER — PIPERACILLIN-TAZOBACTAM 3.375 G IVPB
3.3750 g | Freq: Three times a day (TID) | INTRAVENOUS | Status: AC
  Administered 2017-03-22 (×3): 3.375 g via INTRAVENOUS
  Filled 2017-03-22 (×3): qty 50

## 2017-03-22 MED ORDER — ONDANSETRON HCL 4 MG/2ML IJ SOLN
4.0000 mg | Freq: Three times a day (TID) | INTRAMUSCULAR | Status: DC | PRN
Start: 2017-03-22 — End: 2017-03-24

## 2017-03-22 NOTE — Progress Notes (Signed)
Mother and baby was sleeping. Told Dad I would come back to do bath.

## 2017-03-22 NOTE — Anesthesia Postprocedure Evaluation (Signed)
Anesthesia Post Note  Patient: Jessica Huffman  Procedure(s) Performed: CESAREAN SECTION (N/A )     Patient location during evaluation: PACU Anesthesia Type: Epidural Level of consciousness: awake and alert and oriented Pain management: pain level controlled Vital Signs Assessment: post-procedure vital signs reviewed and stable Respiratory status: spontaneous breathing, nonlabored ventilation and respiratory function stable Cardiovascular status: blood pressure returned to baseline and stable Postop Assessment: no apparent nausea or vomiting, patient able to bend at knees, epidural receding, no backache and no headache Anesthetic complications: no    Last Vitals:  Vitals:   03/22/17 0105 03/22/17 0115  BP:  123/87  Pulse: (!) 112 91  Resp: (!) 24 17  Temp:  36.4 C  SpO2:  100%    Last Pain:  Vitals:   03/22/17 0100  TempSrc:   PainSc: 0-No pain   Pain Goal:                 Keeleigh Terris A.

## 2017-03-22 NOTE — Lactation Note (Addendum)
This note was copied from a baby's chart. Lactation Consultation Note  Patient Name: Jessica Huffman ZOXWR'U Date: 03/22/2017 Reason for consult: Initial assessment  Baby 14 hours old. Mom on Methadone, Heroin 2/18 per nursery note. Mom reports that baby not nursing well, but she and her nurse able to hand express colostrum and use curve-tipped syringe to pre-fill NS. Mom states that baby able to nurse with shield and transfer EBM. Assisted mom with positioning and attempted to latch baby in football position to left breast. Baby very fussy when moved, but then settled at the breast. However, could not elicit a suckle from baby even with stimulation. Mom agreeable to start use of DEBP, so baby taken for hearing screen. Enc mom to continue to put baby to breast with cues, and then supplement baby with EBM. Enc mom to post-pump after baby nurses followed by hand expression.   Mom had questions about continuing to nurse while on Methadone. Enc mom to discuss with pediatrician. Mom reports that she is aware that she not not abruptly stop giving baby breast milk. Again enc mom to discuss with pediatrician. Discussed assessment and interventions with Vernona Rieger, RN.    Maternal Data Has patient been taught Hand Expression?: Yes Does the patient have breastfeeding experience prior to this delivery?: No  Feeding Feeding Type: Breast Fed Length of feed: 0 min  LATCH Score Latch: Too sleepy or reluctant, no latch achieved, no sucking elicited.  Audible Swallowing: None  Type of Nipple: Flat  Comfort (Breast/Nipple): Soft / non-tender  Hold (Positioning): Assistance needed to correctly position infant at breast and maintain latch.  LATCH Score: 4  Interventions Interventions: Breast feeding basics reviewed;Assisted with latch;Skin to skin;Hand express;Support pillows;Adjust position;Breast compression;Position options;DEBP  Lactation Tools Discussed/Used Tools: Nipple Dorris Carnes;Pump Nipple shield  size: 20 Breast pump type: Double-Electric Breast Pump WIC Program: Yes Pump Review: Setup, frequency, and cleaning;Milk Storage Initiated by:: JW Date initiated:: 03/22/17   Consult Status Consult Status: Follow-up Date: 03/23/17 Follow-up type: In-patient    Sherlyn Hay 03/22/2017, 2:16 PM

## 2017-03-22 NOTE — Addendum Note (Signed)
Addendum  created 03/22/17 0957 by Graciela Husbands, CRNA   Sign clinical note

## 2017-03-22 NOTE — Progress Notes (Signed)
Post Partum Day 1 Subjective: Patient doing well this morning, reports mild abdominal pain, denies chest pain or shortness of breath, patient up ad lib, had some flatulence earlier in the morning, denies BM, minimal vaginal bleeding, no drainage from incision site. Plans on breastfeeding and POPs for birth control. Patient requesting Toradol shot for pain.   Objective: Blood pressure 111/75, pulse (!) 123, temperature 98.7 F (37.1 C), temperature source Oral, resp. rate 20, height  (1.575 m), weight 101.6 kg (224 lb), last menstrual period 06/05/2016, SpO2 98 %, unknown if currently breastfeeding.  Physical Exam:  General: alert, cooperative, appears stated age and no distress Lochia: appropriate Uterine Fundus: firm Abdomen: soft, diffuse lower abdominal tenderness Incision: no significant drainage, no dehiscence, no significant erythema DVT Evaluation: No evidence of DVT seen on physical exam. Negative Homan's sign. No cords or calf tenderness.   Recent Labs  03/20/17 1624 03/22/17 0638  HGB 11.8* 10.2*  HCT 34.7* 30.2*    Assessment/Plan: Breastfeeding; discharge pending appropriate post-operative recovery.    LOS: 2 days   Jessica Huffman 03/22/2017, 7:28 AM

## 2017-03-22 NOTE — Transfer of Care (Signed)
Immediate Anesthesia Transfer of Care Note  Patient: Jessica Huffman  Procedure(s) Performed: CESAREAN SECTION (N/A )  Patient Location: PACU  Anesthesia Type:Epidural  Level of Consciousness: awake, alert  and oriented  Airway & Oxygen Therapy: Patient Spontanous Breathing  Post-op Assessment: Report given to RN and Post -op Vital signs reviewed and stable  Post vital signs: Reviewed and stable BP 111/63, HR 108, RR 20, SaO2 100%  Last Vitals:  Vitals:   03/21/17 2228 03/21/17 2230  BP:  125/79  Pulse:  85  Resp:    Temp: 37.1 C   SpO2:      Last Pain:  Vitals:   03/21/17 2228  TempSrc: Oral  PainSc:          Complications: No apparent anesthesia complications

## 2017-03-22 NOTE — Progress Notes (Signed)
CSW acknowledges consult.  CSW attempted to meet with [name], however [name] was meeting the the WIC team. CSW will attempt to visit with [name] at a later time.   Bronwyn Belasco Boyd-Gilyard, MSW, LCSW Clinical Social Work [phone]  

## 2017-03-22 NOTE — Anesthesia Postprocedure Evaluation (Signed)
Anesthesia Post Note  Patient: Jessica Huffman  Procedure(s) Performed: CESAREAN SECTION (N/A )     Patient location during evaluation: Mother Baby Anesthesia Type: Epidural Level of consciousness: awake and alert and oriented Pain management: satisfactory to patient Vital Signs Assessment: post-procedure vital signs reviewed and stable Respiratory status: respiratory function stable Cardiovascular status: stable Postop Assessment: no headache, no backache, epidural receding, patient able to bend at knees, no signs of nausea or vomiting and adequate PO intake Anesthetic complications: no    Last Vitals:  Vitals:   03/22/17 0545 03/22/17 0549  BP: 115/73 111/75  Pulse: 89 (!) 123  Resp:    Temp:    SpO2:  98%    Last Pain:  Vitals:   03/22/17 0859  TempSrc:   PainSc: 5    Pain Goal:                 Carlisle Enke

## 2017-03-23 ENCOUNTER — Inpatient Hospital Stay (HOSPITAL_COMMUNITY): Admission: RE | Admit: 2017-03-23 | Payer: Medicaid Other | Source: Ambulatory Visit

## 2017-03-23 MED ORDER — IBUPROFEN 600 MG PO TABS
600.0000 mg | ORAL_TABLET | Freq: Four times a day (QID) | ORAL | Status: DC
  Administered 2017-03-23 – 2017-03-24 (×4): 600 mg via ORAL
  Filled 2017-03-23 (×4): qty 1

## 2017-03-23 NOTE — Progress Notes (Signed)
POSTOP PROGRESS NOTE  Post OP Day 2 Subjective:  Jessica Huffman is a 26 y.o. G2P1011 [redacted]w[redacted]d s/p PLTCS for arrest of dilation.  No acute events overnight.  Pt denies problems with ambulating, voiding or po intake.  She denies nausea or vomiting.  Pain is well controlled.  She has had flatus. She has not had bowel movement.  Lochia Minimal.   Objective: Blood pressure 107/65, pulse 81, temperature 98.2 F (36.8 C), temperature source Oral, resp. rate 16, height  (1.575 m), weight 101.6 kg (224 lb), last menstrual period 06/05/2016, SpO2 100 %, unknown if currently breastfeeding.  Physical Exam:  General: alert, cooperative and no distress Lochia:normal flow Chest: CTAB Heart: RRR no m/r/g Abdomen: +BS, soft, nontender,  Uterine Fundus: firm DVT Evaluation: No calf swelling or tenderness Extremities: no edema   Recent Labs  03/20/17 1624 03/22/17 0638  HGB 11.8* 10.2*  HCT 34.7* 30.2*    Assessment/Plan:  ASSESSMENT: Jessica Huffman is a 26 y.o. G2P1011 [redacted]w[redacted]d s/p PLTCS. Doing well. 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 Can discharge tomorrow. Can give mom last dose of methadone tomorrow AM.   Plan for discharge tomorrow   LOS: 3 days   Renne Musca, MD PGY-2 Center for [facility], [facility]  03/23/2017, 9:47 AM   I have seen and examined this patient and agree with the management plan.

## 2017-03-23 NOTE — Clinical Social Work Maternal (Signed)
CLINICAL SOCIAL WORK MATERNAL/CHILD NOTE  Patient Details  Name: Jessica Huffman MRN: [id number] Date of Birth: [birth year]  Date:  03/23/2017  Clinical Social Worker Initiating Note:  Laurey Arrow Date/Time: Initiated:  03/23/17/1227     Child's Name:  Jessica Huffman   Biological Parents:  Mother, Father   Need for Interpreter:  None   Reason for Referral:  Current Substance Use/Substance Use During Pregnancy    Address:  [place] [address]    Phone number:  [phone] (home)     Additional phone number:   Household Members/Support Persons (HM/SP):   Household Member/Support Person 1   HM/SP Name Relationship DOB or Age  HM/SP -[place] FOB  09/25/1983  HM/SP -2        HM/SP -3        HM/SP -4        HM/SP -5        HM/SP -6        HM/SP -7        HM/SP -8          Natural Supports (not living in the home):  Immediate Family, Friends, Extended Family, Parent (FOB's family will also provide support. 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   Education:  Some Therapist, occupational arranged:    Financial Resources:  Medicaid   Other Resources:      Cultural/Religious Considerations Which May Impact Care:  None Reported Strengths:  Ability to meet basic needs , Psychotropic Medications, Home prepared for child , Compliance with medical plan    Psychotropic Medications:  Methadone      Pediatrician:       Pediatrician List:   [place]      Pediatrician Fax Number:    Risk Factors/Current Problems:  Substance Use    Cognitive State:  Able to Concentrate , Alert , Linear Thinking , Insightful , Goal Oriented    Mood/Affect:  Happy , Relaxed , Interested , Comfortable    CSW Assessment: CSW met  with [name] to complete an assessment for hx of SA. When CSW arrived, [name] was resting in bed and FOB was watching TV.  With [name]'s permission, CSW asked FOB to step out of the room.  [name] was polite, forthcoming, and receptive to meeting with CSW.  CSW explained CSW's role and encouraged [name] to ask questions.  [name] requested to have FOB to return to the room and participated in completing the assessment; CSW agreed.  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 [name] was February 2018.  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 [name] stated that [name] receives treatment at [facility] and has had several negative drug screens.  [name] denied having is positive screens. CSW informed [name] of the hospital's drug screen policy. [name] was made aware of the 2 drug screenings for the infant.  [name] was understanding and did not have any concerns.  CSW shared with [name] that the infant had a negative UDS, and CSW will monitor the infant's CDS and will make a report to [place] if warranted.   CSW reports having all  necessary items for infant and having a wealth of family support.   CSW Plan/Description:  No Further Intervention Required/No Barriers to Discharge, Sudden Infant Death Syndrome (SIDS) Education, Neonatal Abstinence Syndrome (NAS) Education, Other Information/Referral to [employer], Perinatal Mood and Anxiety Disorder (PMADs) Education, Other Patient/Family Education, Hospital Drug Screen Policy Information   There are no barriers to d/c.  Laurey Arrow, MSW, LCSW Clinical Social Work [phone]  Dimple Nanas, LCSW 03/23/2017, 12:30 PM

## 2017-03-23 NOTE — Lactation Note (Signed)
This note was copied from a baby's chart. Lactation Consultation Note  Patient Name: Jessica Huffman ZOXWR'U Date: 03/23/2017 Reason for consult: Follow-up assessment   Follow up with mom at mother's request. Mom reports they are having difficulty getting infant to feed. Infant was awake and being swaddled by FOB when LC entered room. Infant jittery when unwrapped. Infant placed to left breast in the football hold. Mom with semi compressible areola and areolar edema noted. Attempted to latch infant to the left breast without the NS, he was unable to latch without the NS. Applied # 20 NS and then latched infant. He did not suckle initially. We then hand expressed and primed NS with colostrum, he did begin suckling better. No swallows were noted when there was no colostrum in the NS. He suckled very little at the breast. We then latched infant to right breast and same behavior noted.He was noted to have a few more swallows.  He suckled off and on only when there was colostrum was placed in NS and still was very little suckling. Infant with a lot of tongue thrusting and pushing NS out of mouth with feeding.   We attempted a # 24 NS as mom's areola could accommodate, infant however did not do well with the larger NS and gagged more at the breast. Advised parents to use the # 20 NS with feedings.    Mom and I talked about starting formula due to weight loss and inability to express enough colostrum to for full feedings. We discussed that infant is not sustaining latch well enough to transfer from the breast. Parents agreeable to using formula until mom's milk comes in. # 5 fr feeding tube was placed at the breast, infant would begin suckling and then become overwhelmed once milk started flowing. He was attempted to 5-10 minutes and transferred 1 ml from 5 fr feeding tube. Dad then attempted to finger feed infant with the 5 fr feeding tube. Infant very gaggy and did not transfer any milk. He then fell asleep.  Discussed with parents that 5 cc is not enough food for infant at this age. Supplement amount handout given and reviewed with parents.   Discussed with mom the importance of breast feeding with each feeding using the # 20 NS. Advised mom to pump post BF for 15 minutes and then follow with hand expression. All EBM should be fed back to infant at the next feeding. Discussed with parents that if infant does not transfer well through the 5 fr feeding tube at the breast with the next feeding that infant will need to be switched to a bottle to get appropriate supplement amount to infant.   Report to Angelica Pou, RN and Dr, Sherryll Burger. RN reports infant was not willing to take a bottle yesterday. Dr. Sherryll Burger has ordered an OT/PT consult to work on infant feeding and is to switch infant to Neosure 22 calorie formula.   Mom and dad to call with any questions/concerns.     Maternal Data Formula Feeding for Exclusion: No Has patient been taught Hand Expression?: Yes Does the patient have breastfeeding experience prior to this delivery?: No  Feeding Feeding Type: Breast Fed Length of feed: 10 min (feeding took > 45 minutes, did not suckle more than 10 minutes entire feeding)  LATCH Score Latch: Repeated attempts needed to sustain latch, nipple held in mouth throughout feeding, stimulation needed to elicit sucking reflex.  Audible Swallowing: A few with stimulation  Type of Nipple:  Flat  Comfort (Breast/Nipple): Soft / non-tender  Hold (Positioning): Assistance needed to correctly position infant at breast and maintain latch.  LATCH Score: 6  Interventions Interventions: Breast feeding basics reviewed;Support pillows;Assisted with latch;Position options;Skin to skin;Expressed milk;Reverse pressure;Breast compression;DEBP  Lactation Tools Discussed/Used Tools: 74F feeding tube / Syringe;Pump;Shells;Nipple Shields Nipple shield size: 20 Shell Type: Inverted Breast pump type:  Double-Electric Breast Pump WIC Program: Yes Pump Review: Setup, frequency, and cleaning;Milk Storage Initiated by:: Reviewed and encouraged every 2-3 hours   Consult Status Consult Status: Follow-up Date: 03/24/17 Follow-up type: In-patient    Jessica Huffman Jessica Huffman 03/23/2017, 11:21 AM

## 2017-03-23 NOTE — Lactation Note (Signed)
This note was copied from a baby's chart. Lactation Consultation Note  Patient Name: Jessica Huffman ZOXWR'U Date: 03/23/2017 Reason for consult: NICU baby;Follow-up assessment;1st time breastfeeding   Follow up with mom of 40 hour old infant that is being transferred to NICU. Providing Milk for Your Baby in NICU booklet given. Reviewed pumping schedule and breast milk storage for NICU mom. Colostrum Collection containers and # stickers given, mom to ask NICU for breast milk labels.   Mom asking about a WIC pump. WIC representatives have gone for the day. WIC referral completed with mom's knowledge. Fax would not send today. Will resend in the am. Mom is aware of 10 day WIC pump rental if she is d/c over the weekend, mom reports she will be able to do that.    Maternal Data    Feeding Feeding Type: Bottle Fed - Formula Length of feed: 2 min  LATCH Score                   Interventions    Lactation Tools Discussed/Used Tools: 64F feeding tube / Syringe;Nipple Dorris Carnes;Bottle (Infant did not maintain sucking and gags frequently) Pump Review: Setup, frequency, and cleaning;Milk Storage Initiated by:: reviewed for NICU infant   Consult Status Consult Status: Follow-up Date: 03/24/17 Follow-up type: In-patient    Jessica Huffman 03/23/2017, 4:29 PM

## 2017-03-24 MED ORDER — IBUPROFEN 600 MG PO TABS: 600.0000 mg | ORAL_TABLET | Freq: Four times a day (QID) | ORAL | 0 refills | Status: DC | PRN

## 2017-03-24 MED ORDER — OXYCODONE-ACETAMINOPHEN 5-325 MG PO TABS: 1.0000 | ORAL_TABLET | ORAL | 0 refills | Status: DC | PRN

## 2017-03-24 NOTE — Discharge Instructions (Signed)

## 2017-03-24 NOTE — Discharge Summary (Signed)
OB Discharge Summary     Patient Name: MAKIRA HOLLEMAN DOB: May 11, 1991 MRN: [id number]  Date of admission: 03/20/2017 Delivering MD: Duane Lope H   Date of discharge: 03/24/2017  Admitting diagnosis: INDUCTION Intrauterine pregnancy: [redacted]w[redacted]d     Secondary diagnosis:  Active Problems:   Post-term pregnancy, 40-42 weeks of gestation   Arrest of dilation, delivered, current hospitalization   S/P cesarean section  Additional problems: polyhydramnios; methadone use (hx heroin use)     Discharge diagnosis: Term Pregnancy Delivered                                                                                                Post partum procedures:none  Augmentation: Pitocin, Cytotec and Foley Balloon  Complications: None  Hospital course:  Induction of Labor With Cesarean Section  26 y.o. yo G2P1011 at [redacted]w[redacted]d was admitted to the hospital 03/20/2017 for induction of labor. Patient had a labor course significant for being induced for polyhydramnios noted during postdates testing. She underwent cx ripening starting on 03/20/17. By the end of 10/10 she had progressed to 7cm and no further despite adequate labor. The patient went for cesarean section due to Arrest of Dilation, and delivered a Viable infant. Membrane Rupture Time/Date: )8:20 AM ,03/21/2017    of operation can be found in separate operative Note.  Patient had an uncomplicated postpartum course. She is ambulating, tolerating a regular diet, passing flatus, and urinating well.  Patient is discharged home in stable condition on 03/24/17.                                    Physical exam  Vitals:   03/22/17 1800 03/22/17 2150 03/23/17 0600 03/24/17 0602  BP: 94/60 119/72 107/65 111/71  Pulse: 74 87 81 89  Resp: Temp: 98 F (36.7 C) 98.1 F (36.7 C) 98.2 F (36.8 C) 98.3 F (36.8 C)  TempSrc: Oral Oral Oral Oral  SpO2: 99% 100%    Weight:      Height:       General: alert and cooperative Lochia:  appropriate Uterine Fundus: firm Incision: honeycomb clean/intact DVT Evaluation: No evidence of DVT seen on physical exam. Labs: Lab Results  Component Value Date   WBC 17.6 (H) 03/22/2017   HGB 10.2 (L) 03/22/2017   HCT 30.2 (L) 03/22/2017   MCV 88.3 03/22/2017   PLT 157 03/22/2017   CMP Latest Ref Rng & Units 02/21/2017  Glucose 65 - 99 mg/dL 94  BUN 6 - 20 mg/dL 7  Creatinine 6.44 - 0.34 mg/dL 7.42  Sodium 595 - 638 mmol/L 137  Potassium 3.5 - 5.2 mmol/L 4.8  Chloride 96 - 106 mmol/L 102  CO2 20 - 29 mmol/L 20  Calcium 8.7 - 10.2 mg/dL 8.7  Total Protein 6.0 - 8.5 g/dL 5.9(L)  Total Bilirubin 0.0 - 1.2 mg/dL 0.2  Alkaline Phos 39 - 117 IU/L 99  AST 0 - 40 IU/L 26  ALT 0 - 32 IU/L 15  Discharge instruction: per After Visit Summary and "Baby and Me Booklet".  After visit meds:  Allergies as of 03/24/2017   No Known Allergies     Medication List    STOP taking these medications   calcium carbonate 750 MG chewable tablet Commonly known as:  TUMS EX     TAKE these medications   ibuprofen 600 MG tablet Commonly known as:  ADVIL,MOTRIN Take 1 tablet (600 mg total) by mouth every 6 (six) hours as needed.   methadone 10 MG/5ML solution Commonly known as:  DOLOPHINE Take 100 mg by mouth daily.   oxyCODONE-acetaminophen 5-325 MG tablet Commonly known as:  PERCOCET/ROXICET Take 1 tablet by mouth every 4 (four) hours as needed (pain scale 4-7).   PRENATAL ADULT GUMMY/DHA/FA PO Take 2 tablets by mouth daily.       Diet: routine diet  Activity: Advance as tolerated. Pelvic rest for 6 weeks.   Outpatient follow up:4 weeks Follow up Appt:Future Appointments Date Time Provider Department Center  04/19/2017 10:20 AM Marylene Land, CNM [facility]   Follow up Visit:No Follow-up on file.  Postpartum contraception: Progesterone only pills  Newborn Data: Live born female  Birth Weight: 7 lb 8.1 oz (3405 g) APGAR: 9, 9  Newborn Delivery   Birth  date/time:  03/21/2017 23:33:00 Delivery type:  C-Section, Low Transverse  C-section categorization:  Primary     Baby Feeding: Breast Disposition:NICU- NAS protocol   03/24/2017 Cam Hai, CNM  9:27 AM

## 2017-03-24 NOTE — Lactation Note (Signed)
Lactation Consultation Note: Mother reports that infant is breastfeeding using a #20 nipple shield . She reports seeing small amts of colostrum in shield. Infant is taking a bottle well. She reports that she is pumping approx. 5-10 ml each pumping. Mother plans to rent a [facility] loaner pump for 2 weeks. Mother was advised to continue to pump every 2-3 hours for 15-20 mins.  Discussed treatment and prevention of engorgement. Mother reports that her breast are feeling heavy. Mother advised in good breast massage and the use of ice packs as needed.  Assist mother with applying shells. Discussed milk storage and transporting breastmilk. Suggested to mother to nap frequently and drink to thirst. Mother receptive to all teaching. She is aware of available LC services and community support. Mother has an appt with WIC late Nov. Advised to phone [facility] and inform that she will need earlier scheduled appt. Due to [facility] Loaner pump. Advised mother to ask for assistance from nursing staff and [facility] when here to feed infant.   Patient Name: Jessica HuffmanU Date: 03/24/2017 Reason for consult: Follow-up assessment   Maternal Data    Feeding    LATCH Score                   Interventions    Lactation Tools Discussed/Used     Consult Status Consult Status: Complete    Michel Bickers 03/24/2017, 10:02 AM

## 2017-03-28 ENCOUNTER — Ambulatory Visit: Payer: Self-pay

## 2017-03-28 NOTE — Lactation Note (Signed)
This note was copied from a baby's chart. Lactation Consultation Note  Patient Name: Boy Richardean ChimeraJami Aderman QMVHQ'IToday's Date: 03/28/2017 Reason for consult: Follow-up assessment;1st time breastfeeding;NICU baby   Follow up with mom at infant bedside in NICU. Mom reports infant is orally feeding all feeds. Mom reports she is latching infant to the breast when she is here. She reports he latched better with the NS, enc her to keep using and to pump after to protect milk supply. Mom is to room in tonight for infant to go home tomorrow. Mom plans to work on BF tonight.    Maternal Data Formula Feeding for Exclusion: No Does the patient have breastfeeding experience prior to this delivery?: No  Feeding Feeding Type: Breast Milk Nipple Type: Dr. Irving BurtonBrowns Preemie Length of feed: 30 min  LATCH Score                   Interventions    Lactation Tools Discussed/Used     Consult Status Consult Status: PRN Follow-up type: Call as needed    Ed BlalockSharon S Baley Shands 03/28/2017, 2:24 PM

## 2017-03-30 ENCOUNTER — Encounter: Payer: Self-pay | Admitting: *Deleted

## 2017-04-19 ENCOUNTER — Ambulatory Visit: Payer: Medicaid Other | Admitting: Student

## 2017-04-23 ENCOUNTER — Encounter: Payer: Self-pay | Admitting: Advanced Practice Midwife

## 2017-04-23 ENCOUNTER — Ambulatory Visit (INDEPENDENT_AMBULATORY_CARE_PROVIDER_SITE_OTHER): Payer: Medicaid Other | Admitting: Advanced Practice Midwife

## 2017-04-23 DIAGNOSIS — Z98891 History of uterine scar from previous surgery: Secondary | ICD-10-CM | POA: Diagnosis not present

## 2017-04-23 DIAGNOSIS — Z308 Encounter for other contraceptive management: Secondary | ICD-10-CM | POA: Diagnosis not present

## 2017-04-23 DIAGNOSIS — Z1389 Encounter for screening for other disorder: Secondary | ICD-10-CM | POA: Diagnosis not present

## 2017-04-23 NOTE — Progress Notes (Signed)
Subjective:     Jessica Huffman is a 26 y.o. female who presents for a postpartum visit. She is 5 weeks postpartum following a low cervical transverse Cesarean section. I have fully reviewed the prenatal and intrapartum course. The delivery was at 41 gestational weeks. Outcome: primary cesarean section, low transverse incision. Anesthesia: epidural. Postpartum course has been unremarkable. Baby's course has been unremarkable. Baby is feeding by breast. Bleeding no bleeding. Bowel function is normal. Bladder function is normal. Patient is not sexually active. Contraception method is none. Postpartum depression screening: negative.  The following portions of the patient's history were reviewed and updated as appropriate: allergies, current medications, past family history, past medical history, past social history, past surgical history and problem list.  Planning to go back to work in about 1 week. She works as Scientist, physiologicalreceptionist.  Review of Systems A comprehensive review of systems was negative.   Objective:    BP 114/70   Pulse 98   Wt 207 lb 6.4 oz (94.1 kg)   BMI 37.93 kg/m   General:  alert, cooperative and no distress   Breasts:  inspection negative, no nipple discharge or bleeding, no masses or nodularity palpable  Lungs: clear to auscultation bilaterally  Heart:  regular rate and rhythm, S1, S2 normal, no murmur, click, rub or gallop  Abdomen: soft, non-tender; bowel sounds normal; no masses,  no organomegaly   Vulva:  not evaluated  Vagina: not evaluated  Cervix:  NA  Corpus: not examined  Adnexa:  not evaluated  Rectal Exam: Not performed.        Assessment:     Normal postpartum exam. Pap smear not done at today's visit.  Had pap 02/2017, NIL.   Plan:    1. Contraception: Depo-Provera injections 2. Will return in about 2 weeks for Depo. She does not wish to start today.  3. Follow up in: 1 year or as needed.

## 2017-04-23 NOTE — Patient Instructions (Signed)
Contraception Choices Contraception (birth control) is the use of any methods or devices to prevent pregnancy. Below are some methods to help avoid pregnancy. Hormonal methods  Contraceptive implant. This is a thin, plastic tube containing progesterone hormone. It does not contain estrogen hormone. Your health care provider inserts the tube in the inner part of the upper arm. The tube can remain in place for up to 3 years. After 3 years, the implant must be removed. The implant prevents the ovaries from releasing an egg (ovulation), thickens the cervical mucus to prevent sperm from entering the uterus, and thins the lining of the inside of the uterus.  Progesterone-only injections. These injections are given every 3 months by your health care provider to prevent pregnancy. This synthetic progesterone hormone stops the ovaries from releasing eggs. It also thickens cervical mucus and changes the uterine lining. This makes it harder for sperm to survive in the uterus.  Birth control pills. These pills contain estrogen and progesterone hormone. They work by preventing the ovaries from releasing eggs (ovulation). They also cause the cervical mucus to thicken, preventing the sperm from entering the uterus. Birth control pills are prescribed by a health care provider.Birth control pills can also be used to treat heavy periods.  Minipill. This type of birth control pill contains only the progesterone hormone. They are taken every day of each month and must be prescribed by your health care provider.  Birth control patch. The patch contains hormones similar to those in birth control pills. It must be changed once a week and is prescribed by a health care provider.  Vaginal ring. The ring contains hormones similar to those in birth control pills. It is left in the vagina for 3 weeks, removed for 1 week, and then a new one is put back in place. The patient must be comfortable inserting and removing the ring from  the vagina.A health care provider's prescription is necessary.  Emergency contraception. Emergency contraceptives prevent pregnancy after unprotected sexual intercourse. This pill can be taken right after sex or up to 5 days after unprotected sex. It is most effective the sooner you take the pills after having sexual intercourse. Most emergency contraceptive pills are available without a prescription. Check with your pharmacist. Do not use emergency contraception as your only form of birth control. Barrier methods  Female condom. This is a thin sheath (latex or rubber) that is worn over the penis during sexual intercourse. It can be used with spermicide to increase effectiveness.  Female condom. This is a soft, loose-fitting sheath that is put into the vagina before sexual intercourse.  Diaphragm. This is a soft, latex, dome-shaped barrier that must be fitted by a health care provider. It is inserted into the vagina, along with a spermicidal jelly. It is inserted before intercourse. The diaphragm should be left in the vagina for 6 to 8 hours after intercourse.  Cervical cap. This is a round, soft, latex or plastic cup that fits over the cervix and must be fitted by a health care provider. The cap can be left in place for up to 48 hours after intercourse.  Sponge. This is a soft, circular piece of polyurethane foam. The sponge has spermicide in it. It is inserted into the vagina after wetting it and before sexual intercourse.  Spermicides. These are chemicals that kill or block sperm from entering the cervix and uterus. They come in the form of creams, jellies, suppositories, foam, or tablets. They do not require a prescription. They   are inserted into the vagina with an applicator before having sexual intercourse. The process must be repeated every time you have sexual intercourse. Intrauterine contraception  Intrauterine device (IUD). This is a T-shaped device that is put in a woman's uterus during  a menstrual period to prevent pregnancy. There are 2 types: ? Copper IUD. This type of IUD is wrapped in copper wire and is placed inside the uterus. Copper makes the uterus and fallopian tubes produce a fluid that kills sperm. It can stay in place for 10 years. ? Hormone IUD. This type of IUD contains the hormone progestin (synthetic progesterone). The hormone thickens the cervical mucus and prevents sperm from entering the uterus, and it also thins the uterine lining to prevent implantation of a fertilized egg. The hormone can weaken or kill the sperm that get into the uterus. It can stay in place for 3-5 years, depending on which type of IUD is used. Permanent methods of contraception  Female tubal ligation. This is when the woman's fallopian tubes are surgically sealed, tied, or blocked to prevent the egg from traveling to the uterus.  Hysteroscopic sterilization. This involves placing a small coil or insert into each fallopian tube. Your doctor uses a technique called hysteroscopy to do the procedure. The device causes scar tissue to form. This results in permanent blockage of the fallopian tubes, so the sperm cannot fertilize the egg. It takes about 3 months after the procedure for the tubes to become blocked. You must use another form of birth control for these 3 months.  Female sterilization. This is when the female has the tubes that carry sperm tied off (vasectomy).This blocks sperm from entering the vagina during sexual intercourse. After the procedure, the man can still ejaculate fluid (semen). Natural planning methods  Natural family planning. This is not having sexual intercourse or using a barrier method (condom, diaphragm, cervical cap) on days the woman could become pregnant.  Calendar method. This is keeping track of the length of each menstrual cycle and identifying when you are fertile.  Ovulation method. This is avoiding sexual intercourse during ovulation.  Symptothermal method.  This is avoiding sexual intercourse during ovulation, using a thermometer and ovulation symptoms.  Post-ovulation method. This is timing sexual intercourse after you have ovulated. Regardless of which type or method of contraception you choose, it is important that you use condoms to protect against the transmission of sexually transmitted infections (STIs). Talk with your health care provider about which form of contraception is most appropriate for you. This information is not intended to replace advice given to you by your health care provider. Make sure you discuss any questions you have with your health care provider. Document Released: 05/29/2005 Document Revised: 11/04/2015 Document Reviewed: 11/21/2012 Elsevier Interactive Patient Education  2017 Elsevier Inc.  

## 2017-09-03 ENCOUNTER — Encounter: Payer: Self-pay | Admitting: Advanced Practice Midwife

## 2017-09-03 ENCOUNTER — Ambulatory Visit (INDEPENDENT_AMBULATORY_CARE_PROVIDER_SITE_OTHER): Payer: Medicaid Other | Admitting: Advanced Practice Midwife

## 2017-09-03 VITALS — BP 135/87 | HR 72 | Wt 217.3 lb

## 2017-09-03 DIAGNOSIS — Z30013 Encounter for initial prescription of injectable contraceptive: Secondary | ICD-10-CM

## 2017-09-03 DIAGNOSIS — Z3009 Encounter for other general counseling and advice on contraception: Secondary | ICD-10-CM | POA: Diagnosis not present

## 2017-09-03 LAB — POCT PREGNANCY, URINE: Preg Test, Ur: NEGATIVE

## 2017-09-03 MED ORDER — MEDROXYPROGESTERONE ACETATE 150 MG/ML IM SUSP
150.0000 mg | Freq: Once | INTRAMUSCULAR | Status: AC
  Administered 2017-09-03: 150 mg via INTRAMUSCULAR

## 2017-09-03 NOTE — Progress Notes (Signed)
Subjective:     Patient ID: Jessica Huffman, female   DOB: 04-21-1991, 27 y.o.   MRN: [id number]  Jessica AsalJami L Schirmer is a 27 y.o. G2P1011 who is PP 5 months s/p LTCS. She is here today for birth control. She is breastfeeding, and is interested in a progesterone only method. She has used Nexplanon in the past, but is not interested in that at this time. She would like to use depo provera.   Last pap: 02/2017: NIL     Review of Systems  All other systems reviewed and are negative.      Objective:   Physical Exam  Constitutional: She is oriented to person, place, and time. She appears well-developed and well-nourished. No distress.  Cardiovascular: Normal rate.  Pulmonary/Chest: Effort normal.  Neurological: She is alert and oriented to person, place, and time.  Skin: Skin is dry.  Psychiatric: She has a normal mood and affect.  Nursing note and vitals reviewed.   Results for orders placed or performed in visit on 09/03/17 (from the past 24 hour(s))  Pregnancy, urine POC     Status: None   Collection Time: 09/03/17  3:15 PM  Result Value Ref Range   Preg Test, Ur NEGATIVE NEGATIVE      Counseled on Nexplanon, IUD and Depo. All questions answered. Patient would like to use Depo.  Assessment:     1. Birth control counseling   2. Initiation of Depo Provera        Plan:     Depo today FU in 12 weeks Return precautions reviewed  Thressa ShellerHeather Hogan 3:42 PM 09/03/17

## 2017-09-03 NOTE — Patient Instructions (Signed)
DUE FOR NEXT DEPO 6/10-6/24

## 2017-11-26 ENCOUNTER — Ambulatory Visit: Payer: Medicaid Other

## 2018-04-11 ENCOUNTER — Encounter (HOSPITAL_COMMUNITY): Payer: Self-pay

## 2019-02-21 ENCOUNTER — Other Ambulatory Visit: Payer: Self-pay

## 2019-02-21 DIAGNOSIS — Z20822 Contact with and (suspected) exposure to covid-19: Secondary | ICD-10-CM

## 2019-02-23 LAB — NOVEL CORONAVIRUS, NAA: SARS-CoV-2, NAA: NOT DETECTED

## 2020-03-15 ENCOUNTER — Other Ambulatory Visit: Payer: Self-pay | Admitting: Obstetrics and Gynecology

## 2020-03-15 DIAGNOSIS — F1110 Opioid abuse, uncomplicated: Secondary | ICD-10-CM

## 2020-03-16 LAB — OB RESULTS CONSOLE RUBELLA ANTIBODY, IGM: Rubella: IMMUNE

## 2020-03-16 LAB — OB RESULTS CONSOLE HIV ANTIBODY (ROUTINE TESTING): HIV: NONREACTIVE

## 2020-03-16 LAB — OB RESULTS CONSOLE ABO/RH: RH Type: POSITIVE

## 2020-03-16 LAB — OB RESULTS CONSOLE HEPATITIS B SURFACE ANTIGEN: Hepatitis B Surface Ag: NEGATIVE

## 2020-03-16 LAB — OB RESULTS CONSOLE RPR: RPR: NONREACTIVE

## 2020-03-16 LAB — OB RESULTS CONSOLE ANTIBODY SCREEN: Antibody Screen: NEGATIVE

## 2020-05-31 ENCOUNTER — Encounter: Payer: Self-pay | Admitting: *Deleted

## 2020-06-02 ENCOUNTER — Encounter: Payer: Self-pay | Admitting: *Deleted

## 2020-06-02 ENCOUNTER — Ambulatory Visit: Payer: Medicaid Other

## 2020-06-02 ENCOUNTER — Ambulatory Visit: Payer: 59 | Admitting: *Deleted

## 2020-06-02 ENCOUNTER — Other Ambulatory Visit: Payer: Self-pay

## 2020-06-02 ENCOUNTER — Ambulatory Visit (HOSPITAL_BASED_OUTPATIENT_CLINIC_OR_DEPARTMENT_OTHER): Payer: 59 | Admitting: Obstetrics and Gynecology

## 2020-06-02 ENCOUNTER — Ambulatory Visit: Payer: 59 | Attending: Obstetrics and Gynecology

## 2020-06-02 VITALS — BP 116/72 | HR 115

## 2020-06-02 DIAGNOSIS — F1110 Opioid abuse, uncomplicated: Secondary | ICD-10-CM | POA: Insufficient documentation

## 2020-06-02 DIAGNOSIS — Z363 Encounter for antenatal screening for malformations: Secondary | ICD-10-CM

## 2020-06-02 DIAGNOSIS — O34219 Maternal care for unspecified type scar from previous cesarean delivery: Secondary | ICD-10-CM

## 2020-06-02 DIAGNOSIS — O99320 Drug use complicating pregnancy, unspecified trimester: Secondary | ICD-10-CM

## 2020-06-02 DIAGNOSIS — O99322 Drug use complicating pregnancy, second trimester: Secondary | ICD-10-CM | POA: Diagnosis present

## 2020-06-02 DIAGNOSIS — O99332 Smoking (tobacco) complicating pregnancy, second trimester: Secondary | ICD-10-CM

## 2020-06-02 DIAGNOSIS — F1120 Opioid dependence, uncomplicated: Secondary | ICD-10-CM | POA: Insufficient documentation

## 2020-06-02 DIAGNOSIS — F17210 Nicotine dependence, cigarettes, uncomplicated: Secondary | ICD-10-CM | POA: Diagnosis not present

## 2020-06-02 DIAGNOSIS — Z3A19 19 weeks gestation of pregnancy: Secondary | ICD-10-CM

## 2020-06-02 NOTE — Progress Notes (Signed)
Maternal-Fetal Medicine   Name: Jessica Huffman DOB: 04/06/1991 MRN: [id number] Referring Provider: Belva Agee, MD  I had the pleasure of seeing Jessica Huffman at the [facility].  She is G1011 at 19w 5d gestation. She is here for fetal anatomy scan and for consultation.  Her problems include:  -Methadone maintenance. -Previous cesarean delivery.  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  She has been in remission and has been taking methadone regularly.  Currently, she takes methadone 75 mg daily and does not experience withdrawal symptoms.  She reports her withdrawal symptoms consists watery eyes and feeling restless.  Patient has screened negative on urine toxicology for almost 4 years.  She does not have to make daily visits but gets monthly supply of methadone.  She does not have diabetes or hypertension or any other chronic medical conditions. Past surgical history: Cesarean section Medications: Prenatal vitamins, methadone. Allergies: No known drug allergies. Social history: She smokes 2 cigarettes daily.  Denies alcohol or drug use.  She lives with her partner with the father of her first baby.  He is Caucasian and he is in good health.  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 Family history: Father had DVT.  He died of throat cancer in 2015-09-29 at age 85 years.  Her mother is in good health.  Obstetric history significant for a term cesarean delivery (failure to progress in labor) of a female infant Lonni Fix) weighing 7 pounds 9 ounces at birth. In early September 29, 2019, she had an early spontaneous miscarriage at 12 weeks.  GYN history: No history of abnormal Pap smears or cervical surgeries.  No history of breast disease.  Her previous menstrual cycles were reportedly regular.  Prenatal course.  Patient reports on first trimester screening, the risks of aneuploidies are not increased.  MSAFP screening reportedly showed low risk for  open spina bifida.   Ultrasound We performed fetal anatomy scan.  No markers of aneuploidies or fetal structural defects are seen.  Fetal biometry is consistent with the previously established dates.  Amniotic fluid is normal and good fetal activity seen.  Cardiac views were limited because of fetal position.  Our concerns include: Methadone Maintenance Treatment - Methadone is one of the first drugs used in treating opioid addiction in pregnancy. It is a synthetic opioid and is a full agonist. Opioid withdrawal symptoms are improved with methadone. It also acts as an analgesic.  -It is category C drug.  -Complications (heroin use) include increase in stillbirth (16-fold increase), placental abruption, preterm delivery, fetal growth restriction and chorioamnionitis.  -Pregnancies managed with methadone have better outcomes than pregnancies with illicit drug use.  -Placenta metabolizes methadone and increasing dosage may be required in the second and third trimesters.  -Neonatal withdrawal is a disorder of gastrointestinal, respiratory and central nervous systems. Autonomic symptoms and signs predominate. Methadone dose during pregnancy appears to be unrelated to the severity of withdrawal and the need to treat infants. About 50% to 80% of infants exposed to methadone in utero have clinical features suggestive of neonatal withdrawal, and up to 25% may require treatment. Symptoms include nasal stuffiness, sneezing, diarrhea, tremors, irritability, hyperactive reflexes, and poor feeding.  Buprenorphine is an alternative medication. It is an opioid with mixed agonist-antagonist activity. In patient who do not have withdrawal symptoms, initiation of Buprenorphine can precipitate withdrawal symptoms. Because of this, some patients may resort to illicit drug use. Its main advantage is that it office-based therapy avoids  the need for daily visits. I reassured the patient of the safety of methadone and  congratulated her on remission.   I recommend serial fetal growth assessments till delivery. If the patient continues to smoke cigarettes, I recommend weekly BPP from 36 weeks till delivery.  Cigarette Smoking in Pregnancy Smoking increases the risk of maternal complications that include placental abruption and placenta previa. Fetal complications include fetal growth restriction, prematurity, and stillbirth.  Smoking cessation at any gestational age is beneficial and should routinely be addressed in her prenatal visits. Nicotine-replacement therapy (NRT) can be considered if nonpharmacological approaches have failed. Its chief benefit is that it does not contain other toxic substances present in cigarettes. Cigarettes, in addition to nicotine and Carbon monoxide, contain several other toxic substances.  Transdermal patch of 21 mg/day for 2 weeks followed by 14 mg/day for 1 to 2 weeks and then 7 mg/day (maintenance) can be considered in her case. Overall, the benefit of NRT outweighs cigarette smoking as cigarettes contain several other compounds including carcinogens that are harmful to her and her pregnancy.  Previous cesarean delivery We discussed VBAC and its benefits. Repeat cesarean deliveries increase the likelihood of placenta previa or placenta accreta spectrum (PAS). Patient is keen on repeat cesarean delivery.  Recommendations: -An appointment was made for her to return in 4 weeks for completion of fetal anatomy (cardiac views). -Serial fetal growth assessments till delivery and they may be performed at your office. -If patient continues to smoke cigarettes, we recommend weekly BPP from [redacted] weeks gestation till delivery.  Thank you for consultation.  Please call me if you have any questions at [facility], [facility].  Consultation including face-to-face counseling 45 minutes.

## 2020-06-03 ENCOUNTER — Other Ambulatory Visit: Payer: Self-pay | Admitting: *Deleted

## 2020-06-03 DIAGNOSIS — Z362 Encounter for other antenatal screening follow-up: Secondary | ICD-10-CM

## 2020-06-30 ENCOUNTER — Ambulatory Visit: Payer: Medicaid Other

## 2020-10-11 NOTE — Patient Instructions (Addendum)
Jessica Huffman  10/11/2020   Your procedure is scheduled on:  10/22/2020  Arrive at 0530 at Entrance C on [address] at [facility]  and [employer]. You are invited to use the FREE valet parking or use the Visitor's parking deck.  Pick up the phone at the desk and dial [phone].  Call this number if you have problems the morning of surgery: [phone]  Remember:   Do not eat food:(After Midnight) Desps de medianoche.  Do not drink clear liquids: (After Midnight) Desps de medianoche.  Take these medicines the morning of surgery with A SIP OF WATER:  methadone  Do not wear jewelry, make-up or nail polish.  Do not wear lotions, powders, or perfumes. Do not wear deodorant.  Do not shave 48 hours prior to surgery.  Do not bring valuables to the hospital.  [facility] is not   responsible for any belongings or valuables brought to the hospital.  Contacts, dentures or bridgework may not be worn into surgery.  Leave suitcase in the car. After surgery it may be brought to your room.  For patients admitted to the hospital, checkout time is 11:00 AM the day of              discharge.      Please read over the following fact sheets that you were given:     Preparing for Surgery

## 2020-10-12 ENCOUNTER — Telehealth (HOSPITAL_COMMUNITY): Payer: Self-pay | Admitting: *Deleted

## 2020-10-12 NOTE — Telephone Encounter (Signed)
Preadmission screen  

## 2020-10-13 ENCOUNTER — Telehealth (HOSPITAL_COMMUNITY): Payer: Self-pay | Admitting: *Deleted

## 2020-10-13 NOTE — Telephone Encounter (Signed)
Preadmission screen  

## 2020-10-14 ENCOUNTER — Encounter (HOSPITAL_COMMUNITY): Payer: Self-pay

## 2020-10-20 ENCOUNTER — Other Ambulatory Visit: Payer: Self-pay

## 2020-10-20 ENCOUNTER — Encounter (HOSPITAL_COMMUNITY)
Admission: RE | Admit: 2020-10-20 | Discharge: 2020-10-20 | Disposition: A | Discharge status: Home / Self Care | Payer: 59 | Source: Ambulatory Visit | Attending: Obstetrics and Gynecology | Admitting: Obstetrics and Gynecology

## 2020-10-20 ENCOUNTER — Other Ambulatory Visit (HOSPITAL_COMMUNITY)
Admission: RE | Admit: 2020-10-20 | Discharge: 2020-10-20 | Disposition: A | Discharge status: Home / Self Care | Payer: 59 | Source: Ambulatory Visit | Attending: Obstetrics and Gynecology | Admitting: Obstetrics and Gynecology

## 2020-10-20 DIAGNOSIS — Z01812 Encounter for preprocedural laboratory examination: Secondary | ICD-10-CM | POA: Insufficient documentation

## 2020-10-20 DIAGNOSIS — Z20822 Contact with and (suspected) exposure to covid-19: Secondary | ICD-10-CM | POA: Insufficient documentation

## 2020-10-20 LAB — TYPE AND SCREEN
ABO/RH(D): A POS
Antibody Screen: NEGATIVE

## 2020-10-20 LAB — CBC
HCT: 38.6 % (ref 36.0–46.0)
Hemoglobin: 12.7 g/dL (ref 12.0–15.0)
MCH: 30.3 pg (ref 26.0–34.0)
MCHC: 32.9 g/dL (ref 30.0–36.0)
MCV: 92.1 fL (ref 80.0–100.0)
Platelets: 189 10*3/uL (ref 150–400)
RBC: 4.19 MIL/uL (ref 3.87–5.11)
RDW: 13.1 % (ref 11.5–15.5)
WBC: 6.3 10*3/uL (ref 4.0–10.5)
nRBC: 0 % (ref 0.0–0.2)

## 2020-10-20 LAB — SARS CORONAVIRUS 2 (TAT 6-24 HRS): SARS Coronavirus 2: NEGATIVE

## 2020-10-20 LAB — RPR: RPR Ser Ql: NONREACTIVE

## 2020-10-21 NOTE — H&P (Signed)
Jessica Huffman is a 30 y.o. female presenting for repeat cesarean section. 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 OB History    Gravida  3   Para  1   Term  1   Preterm  0   AB  1   Living  1     SAB  0   IAB  1   Ectopic  0   Multiple  0   Live Births  1          Past Medical History:  Diagnosis Date  . History of heroin abuse [facility])    Past Surgical History:  Procedure Laterality Date  . CESAREAN SECTION N/A 03/21/2017   Procedure: CESAREAN SECTION;  Surgeon: 05/21/2017, MD;  Location: [facility] BIRTHING SUITES;  Service: Obstetrics;  Laterality: N/A;   Family History: family history includes Cancer in her father. Social History:  reports that she has been smoking cigarettes. She has been smoking about 0.25 packs per day. She has never used smokeless tobacco. She reports current drug use. Drug: Heroin. She reports that she does not drink alcohol.     Maternal Diabetes: No Genetic Screening: Normal Maternal Ultrasounds/Referrals: Normal Fetal Ultrasounds or other Referrals:  None Maternal Substance Abuse:  No Significant Maternal Medications:  Meds include: Other:  methadone Significant Maternal Lab Results:  Group B Strep negative Other Comments:  None  Review of Systems  Constitutional: Negative for fever.  Eyes: Negative for visual disturbance.  Gastrointestinal: Negative for abdominal pain.  Neurological: Negative for headaches.   Maternal Medical History:  Fetal activity: Perceived fetal activity is normal.        currently breastfeeding. Maternal Exam:  Abdomen: Fetal presentation: vertex     Physical Exam Cardiovascular:     Rate and Rhythm: Normal rate.  Pulmonary:     Effort: Pulmonary effort is normal.     Prenatal labs: ABO, Rh: --/--/A POS (05/11 08-08-1974) Antibody: NEG (05/11 0918) Rubella: Immune (10/05 0000) RPR: NON REACTIVE (05/11 0909)  HBsAg: Negative (10/05 0000)  HIV: Non-reactive  (10/05 0000)  GBS:   negative 09/22/20  Assessment/Plan: 30 yo G3P1 at 40 0/7 wks for repeat cesarean section   07-19-2006 II 10/21/2020, 6:18 PM

## 2020-10-22 ENCOUNTER — Encounter (HOSPITAL_COMMUNITY): Payer: Self-pay | Admitting: Obstetrics and Gynecology

## 2020-10-22 ENCOUNTER — Inpatient Hospital Stay (HOSPITAL_COMMUNITY)
Admission: RE | Admit: 2020-10-22 | Discharge: 2020-10-25 | DRG: 787 | Disposition: A | Discharge status: Home / Self Care | Payer: 59 | Attending: Obstetrics and Gynecology | Admitting: Obstetrics and Gynecology

## 2020-10-22 ENCOUNTER — Inpatient Hospital Stay (HOSPITAL_COMMUNITY): Payer: 59 | Admitting: Anesthesiology

## 2020-10-22 ENCOUNTER — Encounter (HOSPITAL_COMMUNITY)
Admission: RE | Disposition: A | Discharge status: Home / Self Care | Payer: Self-pay | Source: Home / Self Care | Attending: Obstetrics and Gynecology

## 2020-10-22 ENCOUNTER — Other Ambulatory Visit: Payer: Self-pay

## 2020-10-22 DIAGNOSIS — F1120 Opioid dependence, uncomplicated: Secondary | ICD-10-CM | POA: Diagnosis present

## 2020-10-22 DIAGNOSIS — F17210 Nicotine dependence, cigarettes, uncomplicated: Secondary | ICD-10-CM | POA: Diagnosis present

## 2020-10-22 DIAGNOSIS — O99334 Smoking (tobacco) complicating childbirth: Secondary | ICD-10-CM | POA: Diagnosis present

## 2020-10-22 DIAGNOSIS — Z20822 Contact with and (suspected) exposure to covid-19: Secondary | ICD-10-CM | POA: Diagnosis present

## 2020-10-22 DIAGNOSIS — O34211 Maternal care for low transverse scar from previous cesarean delivery: Principal | ICD-10-CM | POA: Diagnosis present

## 2020-10-22 DIAGNOSIS — Z98891 History of uterine scar from previous surgery: Secondary | ICD-10-CM

## 2020-10-22 DIAGNOSIS — O99324 Drug use complicating childbirth: Secondary | ICD-10-CM | POA: Diagnosis present

## 2020-10-22 DIAGNOSIS — Z3A40 40 weeks gestation of pregnancy: Secondary | ICD-10-CM | POA: Diagnosis not present

## 2020-10-22 SURGERY — Surgical Case
Anesthesia: Spinal | Wound class: Clean Contaminated

## 2020-10-22 MED ORDER — DIPHENHYDRAMINE HCL 50 MG/ML IJ SOLN: 12.5000 mg | INTRAMUSCULAR | Status: DC | PRN

## 2020-10-22 MED ORDER — NALBUPHINE HCL 10 MG/ML IJ SOLN: 5.0000 mg | INTRAMUSCULAR | Status: DC | PRN

## 2020-10-22 MED ORDER — MORPHINE SULFATE (PF) 0.5 MG/ML IJ SOLN
INTRAMUSCULAR | Status: DC | PRN
  Administered 2020-10-22: .15 mg via INTRATHECAL

## 2020-10-22 MED ORDER — OXYTOCIN-SODIUM CHLORIDE 30-0.9 UT/500ML-% IV SOLN
INTRAVENOUS | Status: DC | PRN
  Administered 2020-10-22: 300 mL via INTRAVENOUS

## 2020-10-22 MED ORDER — CEFAZOLIN SODIUM-DEXTROSE 2-4 GM/100ML-% IV SOLN
INTRAVENOUS | Status: AC
  Filled 2020-10-22: qty 100

## 2020-10-22 MED ORDER — BUPIVACAINE IN DEXTROSE 0.75-8.25 % IT SOLN
INTRATHECAL | Status: DC | PRN
  Administered 2020-10-22: 1.6 mL via INTRATHECAL

## 2020-10-22 MED ORDER — POVIDONE-IODINE 10 % EX SWAB: 2.0000 "application " | Freq: Once | CUTANEOUS | Status: DC

## 2020-10-22 MED ORDER — LACTATED RINGERS IV SOLN: INTRAVENOUS | Status: DC

## 2020-10-22 MED ORDER — CEFAZOLIN SODIUM-DEXTROSE 2-4 GM/100ML-% IV SOLN
2.0000 g | INTRAVENOUS | Status: AC
  Administered 2020-10-22: 2 g via INTRAVENOUS

## 2020-10-22 MED ORDER — MENTHOL 3 MG MT LOZG: 1.0000 | LOZENGE | OROMUCOSAL | Status: DC | PRN

## 2020-10-22 MED ORDER — NALOXONE HCL 0.4 MG/ML IJ SOLN: 0.4000 mg | INTRAMUSCULAR | Status: DC | PRN

## 2020-10-22 MED ORDER — ACETAMINOPHEN 500 MG PO TABS
1000.0000 mg | ORAL_TABLET | Freq: Four times a day (QID) | ORAL | Status: DC
  Administered 2020-10-22 – 2020-10-25 (×12): 1000 mg via ORAL
  Filled 2020-10-22 (×13): qty 2

## 2020-10-22 MED ORDER — KETOROLAC TROMETHAMINE 30 MG/ML IJ SOLN: 30.0000 mg | Freq: Four times a day (QID) | INTRAMUSCULAR | Status: AC | PRN

## 2020-10-22 MED ORDER — FENTANYL CITRATE (PF) 100 MCG/2ML IJ SOLN
INTRAMUSCULAR | Status: DC | PRN
  Administered 2020-10-22: 15 ug via INTRATHECAL

## 2020-10-22 MED ORDER — METHADONE HCL 10 MG/ML PO CONC
90.0000 mg | Freq: Every day | ORAL | Status: DC
  Administered 2020-10-23 – 2020-10-25 (×3): 90 mg via ORAL
  Filled 2020-10-22 (×3): qty 9

## 2020-10-22 MED ORDER — OXYTOCIN-SODIUM CHLORIDE 30-0.9 UT/500ML-% IV SOLN: 2.5000 [IU]/h | INTRAVENOUS | Status: AC

## 2020-10-22 MED ORDER — SOD CITRATE-CITRIC ACID 500-334 MG/5ML PO SOLN
30.0000 mL | ORAL | Status: AC
  Administered 2020-10-22: 30 mL via ORAL

## 2020-10-22 MED ORDER — ACETAMINOPHEN 500 MG PO TABS: 1000.0000 mg | ORAL_TABLET | Freq: Four times a day (QID) | ORAL | Status: DC

## 2020-10-22 MED ORDER — ONDANSETRON HCL 4 MG/2ML IJ SOLN: 4.0000 mg | Freq: Three times a day (TID) | INTRAMUSCULAR | Status: DC | PRN

## 2020-10-22 MED ORDER — MORPHINE SULFATE (PF) 0.5 MG/ML IJ SOLN
INTRAMUSCULAR | Status: AC
  Filled 2020-10-22: qty 10

## 2020-10-22 MED ORDER — DIPHENHYDRAMINE HCL 25 MG PO CAPS: 25.0000 mg | ORAL_CAPSULE | ORAL | Status: DC | PRN

## 2020-10-22 MED ORDER — ZOLPIDEM TARTRATE 5 MG PO TABS: 5.0000 mg | ORAL_TABLET | Freq: Every evening | ORAL | Status: DC | PRN

## 2020-10-22 MED ORDER — SODIUM CHLORIDE 0.9 % IR SOLN
Status: DC | PRN
Start: 2020-10-22 — End: 2020-10-22
  Administered 2020-10-22: 1000 mL

## 2020-10-22 MED ORDER — SODIUM CHLORIDE 0.9% FLUSH: 3.0000 mL | INTRAVENOUS | Status: DC | PRN

## 2020-10-22 MED ORDER — SIMETHICONE 80 MG PO CHEW: 80.0000 mg | CHEWABLE_TABLET | ORAL | Status: DC | PRN

## 2020-10-22 MED ORDER — SENNOSIDES-DOCUSATE SODIUM 8.6-50 MG PO TABS
2.0000 | ORAL_TABLET | Freq: Every day | ORAL | Status: DC
  Administered 2020-10-23 – 2020-10-25 (×3): 2 via ORAL
  Filled 2020-10-22 (×3): qty 2

## 2020-10-22 MED ORDER — MEPERIDINE HCL 25 MG/ML IJ SOLN: 6.2500 mg | INTRAMUSCULAR | Status: DC | PRN

## 2020-10-22 MED ORDER — LACTATED RINGERS IV SOLN: INTRAVENOUS | Status: DC | PRN

## 2020-10-22 MED ORDER — PHENYLEPHRINE HCL-NACL 20-0.9 MG/250ML-% IV SOLN
INTRAVENOUS | Status: DC | PRN
  Administered 2020-10-22: 60 ug/min via INTRAVENOUS

## 2020-10-22 MED ORDER — NALBUPHINE HCL 10 MG/ML IJ SOLN: 5.0000 mg | Freq: Once | INTRAMUSCULAR | Status: DC | PRN

## 2020-10-22 MED ORDER — DIBUCAINE (PERIANAL) 1 % EX OINT: 1.0000 "application " | TOPICAL_OINTMENT | CUTANEOUS | Status: DC | PRN

## 2020-10-22 MED ORDER — FENTANYL CITRATE (PF) 100 MCG/2ML IJ SOLN
INTRAMUSCULAR | Status: AC
  Filled 2020-10-22: qty 2

## 2020-10-22 MED ORDER — STERILE WATER FOR IRRIGATION IR SOLN
Status: DC | PRN
  Administered 2020-10-22: 1000 mL

## 2020-10-22 MED ORDER — FENTANYL CITRATE (PF) 100 MCG/2ML IJ SOLN
25.0000 ug | INTRAMUSCULAR | Status: DC | PRN
  Administered 2020-10-22 (×2): 25 ug via INTRAVENOUS

## 2020-10-22 MED ORDER — KETOROLAC TROMETHAMINE 30 MG/ML IJ SOLN
30.0000 mg | Freq: Four times a day (QID) | INTRAMUSCULAR | Status: AC | PRN
  Administered 2020-10-22 – 2020-10-23 (×3): 30 mg via INTRAVENOUS
  Filled 2020-10-22 (×2): qty 1

## 2020-10-22 MED ORDER — WITCH HAZEL-GLYCERIN EX PADS: 1.0000 "application " | MEDICATED_PAD | CUTANEOUS | Status: DC | PRN

## 2020-10-22 MED ORDER — LIDOCAINE HCL (PF) 2 % IJ SOLN
INTRAMUSCULAR | Status: AC
  Filled 2020-10-22: qty 20

## 2020-10-22 MED ORDER — COCONUT OIL OIL
1.0000 "application " | TOPICAL_OIL | Status: DC | PRN
  Administered 2020-10-23: 1 via TOPICAL

## 2020-10-22 MED ORDER — DIPHENHYDRAMINE HCL 25 MG PO CAPS: 25.0000 mg | ORAL_CAPSULE | Freq: Four times a day (QID) | ORAL | Status: DC | PRN

## 2020-10-22 MED ORDER — NALOXONE HCL 4 MG/10ML IJ SOLN
1.0000 ug/kg/h | INTRAVENOUS | Status: DC | PRN
  Filled 2020-10-22: qty 5

## 2020-10-22 MED ORDER — TETANUS-DIPHTH-ACELL PERTUSSIS 5-2.5-18.5 LF-MCG/0.5 IM SUSY: 0.5000 mL | PREFILLED_SYRINGE | Freq: Once | INTRAMUSCULAR | Status: DC

## 2020-10-22 MED ORDER — SCOPOLAMINE 1 MG/3DAYS TD PT72
1.0000 | MEDICATED_PATCH | Freq: Once | TRANSDERMAL | Status: AC
  Administered 2020-10-22: 1.5 mg via TRANSDERMAL
  Filled 2020-10-22: qty 1

## 2020-10-22 MED ORDER — OXYCODONE HCL 5 MG PO TABS
5.0000 mg | ORAL_TABLET | ORAL | Status: DC | PRN
  Administered 2020-10-23: 5 mg via ORAL
  Administered 2020-10-23 – 2020-10-24 (×7): 10 mg via ORAL
  Administered 2020-10-25: 5 mg via ORAL
  Administered 2020-10-25 (×2): 10 mg via ORAL
  Filled 2020-10-22: qty 2
  Filled 2020-10-22: qty 1
  Filled 2020-10-22 (×2): qty 2
  Filled 2020-10-22: qty 1
  Filled 2020-10-22 (×6): qty 2

## 2020-10-22 MED ORDER — SIMETHICONE 80 MG PO CHEW
80.0000 mg | CHEWABLE_TABLET | Freq: Three times a day (TID) | ORAL | Status: DC
  Administered 2020-10-22 – 2020-10-25 (×8): 80 mg via ORAL
  Filled 2020-10-22 (×8): qty 1

## 2020-10-22 MED ORDER — KETOROLAC TROMETHAMINE 30 MG/ML IJ SOLN
INTRAMUSCULAR | Status: AC
  Filled 2020-10-22: qty 1

## 2020-10-22 MED ORDER — ONDANSETRON HCL 4 MG/2ML IJ SOLN
INTRAMUSCULAR | Status: DC | PRN
  Administered 2020-10-22: 4 mg via INTRAVENOUS

## 2020-10-22 MED ORDER — SOD CITRATE-CITRIC ACID 500-334 MG/5ML PO SOLN
ORAL | Status: AC
  Filled 2020-10-22: qty 30

## 2020-10-22 MED ORDER — PRENATAL MULTIVITAMIN CH
1.0000 | ORAL_TABLET | Freq: Every day | ORAL | Status: DC
  Administered 2020-10-22 – 2020-10-24 (×3): 1 via ORAL
  Filled 2020-10-22 (×3): qty 1

## 2020-10-22 SURGICAL SUPPLY — 38 items
ADH SKN CLS APL DERMABOND .7 (GAUZE/BANDAGES/DRESSINGS)
APL SKNCLS STERI-STRIP NONHPOA (GAUZE/BANDAGES/DRESSINGS) ×1
BENZOIN TINCTURE PRP APPL 2/3 (GAUZE/BANDAGES/DRESSINGS) ×1 IMPLANT
CHLORAPREP W/TINT 26ML (MISCELLANEOUS) ×2 IMPLANT
CLAMP CORD UMBIL (MISCELLANEOUS) IMPLANT
CLOSURE STERI STRIP 1/2 X4 (GAUZE/BANDAGES/DRESSINGS) ×1 IMPLANT
CLOTH BEACON ORANGE TIMEOUT ST (SAFETY) ×2 IMPLANT
DERMABOND ADVANCED (GAUZE/BANDAGES/DRESSINGS)
DERMABOND ADVANCED .7 DNX12 (GAUZE/BANDAGES/DRESSINGS) IMPLANT
DRSG OPSITE POSTOP 4X10 (GAUZE/BANDAGES/DRESSINGS) ×2 IMPLANT
ELECT REM PT RETURN 9FT ADLT (ELECTROSURGICAL) ×2
ELECTRODE REM PT RTRN 9FT ADLT (ELECTROSURGICAL) ×1 IMPLANT
EXTRACTOR VACUUM M CUP 4 TUBE (SUCTIONS) IMPLANT
GLOVE BIO SURGEON STRL SZ7.5 (GLOVE) ×2 IMPLANT
GLOVE BIOGEL PI IND STRL 7.0 (GLOVE) ×1 IMPLANT
GLOVE BIOGEL PI INDICATOR 7.0 (GLOVE) ×1
GOWN STRL REUS W/TWL LRG LVL3 (GOWN DISPOSABLE) ×4 IMPLANT
KIT ABG SYR 3ML LUER SLIP (SYRINGE) ×2 IMPLANT
NDL HYPO 25X5/8 SAFETYGLIDE (NEEDLE) ×1 IMPLANT
NEEDLE HYPO 25X5/8 SAFETYGLIDE (NEEDLE) ×2 IMPLANT
NS IRRIG 1000ML POUR BTL (IV SOLUTION) ×2 IMPLANT
PACK C SECTION WH (CUSTOM PROCEDURE TRAY) ×2 IMPLANT
PAD ABD 7.5X8 STRL (GAUZE/BANDAGES/DRESSINGS) ×1 IMPLANT
PAD OB MATERNITY 4.3X12.25 (PERSONAL CARE ITEMS) ×2 IMPLANT
PENCIL SMOKE EVAC W/HOLSTER (ELECTROSURGICAL) ×2 IMPLANT
SPONGE GAUZE 4X4 12PLY STER LF (GAUZE/BANDAGES/DRESSINGS) ×2 IMPLANT
STRIP CLOSURE SKIN 1/2X4 (GAUZE/BANDAGES/DRESSINGS) IMPLANT
SUT MNCRL 0 VIOLET CTX 36 (SUTURE) ×4 IMPLANT
SUT MONOCRYL 0 CTX 36 (SUTURE) ×8
SUT PDS AB 0 CTX 60 (SUTURE) ×2 IMPLANT
SUT PLAIN 0 NONE (SUTURE) IMPLANT
SUT PLAIN 2 0 (SUTURE)
SUT PLAIN 2 0 XLH (SUTURE) IMPLANT
SUT PLAIN ABS 2-0 CT1 27XMFL (SUTURE) IMPLANT
SUT VIC AB 4-0 KS 27 (SUTURE) ×2 IMPLANT
TOWEL OR 17X24 6PK STRL BLUE (TOWEL DISPOSABLE) ×2 IMPLANT
TRAY FOLEY W/BAG SLVR 14FR LF (SET/KITS/TRAYS/PACK) ×2 IMPLANT
WATER STERILE IRR 1000ML POUR (IV SOLUTION) ×2 IMPLANT

## 2020-10-22 NOTE — Progress Notes (Signed)
No change to H&P per patient history Reviewed procedure-cesarean section NKDA All questions answered She states she understands and agrees

## 2020-10-22 NOTE — Brief Op Note (Signed)
10/22/2020  8:45 AM  PATIENT:  Jessica Huffman  30 y.o. female  PRE-OPERATIVE DIAGNOSIS:  PREVIOUS X 1  POST-OPERATIVE DIAGNOSIS:  PREVIOUS X 1  PROCEDURE:  Procedure(s): REPEAT CESAREAN SECTION EDC: 10-22-20  ALLERG: NKDA (N/A)  SURGEON:  Surgeon(s) and Role:    * Harold Hedge, MD - Primary  PHYSICIAN ASSISTANT:   ASSISTANTS: none   ANESTHESIA:   spinal  EBL:  406 ml   BLOOD ADMINISTERED:none  DRAINS: Urinary Catheter (Foley)   LOCAL MEDICATIONS USED:  NONE  SPECIMEN:  Source of Specimen:  placenta  DISPOSITION OF SPECIMEN:  PATHOLOGY  COUNTS:  YES  TOURNIQUET:  * No tourniquets in log *  DICTATION: .Other Dictation: Dictation Number [id number]  PLAN OF CARE: Admit to inpatient   PATIENT DISPOSITION:  PACU - hemodynamically stable.   Delay start of Pharmacological VTE agent (>24hrs) due to surgical blood loss or risk of bleeding: not applicable

## 2020-10-22 NOTE — Lactation Note (Signed)
This note was copied from a baby's chart. Lactation Consultation Note  Patient Name: Jessica Huffman [id number] Date: 10/22/2020 Reason for consult: Initial assessment;Mother's request;1st time breastfeeding;Term Age:32 hours  On arrival infant still cueing, LC assisted Mom latching on the right breast in cross cradle with signs of milk transfer.  Mom states initial latch before LC arrival 40 minutes and denied any pain with previous and current feeding.  We did review how to ensure lips are flanged out and to compress the breast and look for signs of milk transfer.   Mom does not have a manual pump at home. LC set up and reviewed pump parts assembly and cleaning. LC could not assess flange size as Mom nursing through out the visit.   Plan 1. To feed based on cues 8-12x in 24 hr period no more than 4 hrs without an attempt. Mom to offer both breasts and look for signs of milk transfer.           2. Mom use manual pump to pre pump 5-10 minutes and offer any EBM via spoon or finger if not able to get infant to latch.          3. I and O sheet reviewed.           4. LC brochure of inpatient and outpatient services reviewed.  All questions answered at the end of the visit.  Maternal Data Has patient been taught Hand Expression?: Yes Does the patient have breastfeeding experience prior to this delivery?: Yes How long did the patient breastfeed?: pumped and bottle fed for 6 months  Feeding Mother's Current Feeding Choice: Breast Milk  LATCH Score Latch: Repeated attempts needed to sustain latch, nipple held in mouth throughout feeding, stimulation needed to elicit sucking reflex.  Audible Swallowing: Spontaneous and intermittent  Type of Nipple: Everted at rest and after stimulation  Comfort (Breast/Nipple): Soft / non-tender  Hold (Positioning): Assistance needed to correctly position infant at breast and maintain latch.  LATCH Score: 8   Lactation Tools Discussed/Used Tools:  Pump;Flanges Breast pump type: Manual Pump Education: Setup, frequency, and cleaning;Milk Storage Reason for Pumping: Increase stimulation Pumping frequency: every 3 hrs for 15 minutes  Interventions Interventions: Breast feeding basics reviewed;Support pillows;Education;Assisted with latch;Position options;Skin to skin;Expressed milk;Breast massage;Hand express;Hand pump;Breast compression;Adjust position  Discharge Pump: Manual WIC Program: No  Consult Status Consult Status: Follow-up Date: 10/23/20 Follow-up type: In-patient    Jessica Treat  Huffman 10/22/2020, 3:39 PM

## 2020-10-22 NOTE — Anesthesia Preprocedure Evaluation (Signed)
Anesthesia Evaluation  Patient identified by MRN, date of birth, ID band Patient awake    Reviewed: Allergy & Precautions, NPO status , Patient's Chart, lab work & pertinent test results  Airway Mallampati: II  TM Distance: >3 FB     Dental  (+) Dental Advisory Given   Pulmonary Current Smoker,    breath sounds clear to auscultation       Cardiovascular negative cardio ROS   Rhythm:Regular Rate:Normal     Neuro/Psych negative neurological ROS     GI/Hepatic negative GI ROS, (+)     substance abuse  ,   Endo/Other  negative endocrine ROS  Renal/GU negative Renal ROS     Musculoskeletal negative musculoskeletal ROS (+) narcotic dependent  Abdominal   Peds  Hematology negative hematology ROS (+)   Anesthesia Other Findings   Reproductive/Obstetrics                            Lab Results  Component Value Date   WBC 6.3 10/20/2020   HGB 12.7 10/20/2020   HCT 38.6 10/20/2020   MCV 92.1 10/20/2020   PLT 189 10/20/2020    Anesthesia Physical Anesthesia Plan  ASA: II  Anesthesia Plan: Spinal   Post-op Pain Management:    Induction:   PONV Risk Score and Plan: 1 and Treatment may vary due to age or medical condition, Dexamethasone and Ondansetron  Airway Management Planned: Natural Airway  Additional Equipment:   Intra-op Plan:   Post-operative Plan:   Informed Consent: I have reviewed the patients History and Physical, chart, labs and discussed the procedure including the risks, benefits and alternatives for the proposed anesthesia with the patient or authorized representative who has indicated his/her understanding and acceptance.     Dental advisory given  Plan Discussed with: CRNA and Surgeon  Anesthesia Plan Comments:         Anesthesia Quick Evaluation

## 2020-10-22 NOTE — Anesthesia Procedure Notes (Signed)
Spinal  Patient location during procedure: OR Start time: 10/22/2020 7:39 AM End time: 10/22/2020 7:44 AM Reason for block: surgical anesthesia Staffing Performed: anesthesiologist  Anesthesiologist: Marcene Duos, MD Preanesthetic Checklist Completed: patient identified, IV checked, site marked, risks and benefits discussed, surgical consent, monitors and equipment checked, pre-op evaluation and timeout performed Spinal Block Patient position: sitting Prep: DuraPrep Patient monitoring: heart rate, cardiac monitor, continuous pulse ox and blood pressure Approach: midline Location: L4-5 Injection technique: single-shot Needle Needle type: Pencan  Needle gauge: 24 G Needle length: 9 cm Assessment Sensory level: T4 Events: CSF return

## 2020-10-22 NOTE — Social Work (Signed)
CSW attempted to meet with [name] to complete assessment. CSW observed [name] breast feeding and visitor present. [name] requested CSW return to complete assessment. CSW will follow-up prior to discharge.   Edmund Rick, MSW, LCSWA Clinical Social Work [facility] [phone] 

## 2020-10-22 NOTE — Transfer of Care (Signed)
Immediate Anesthesia Transfer of Care Note  Patient: Jessica Huffman  Procedure(s) Performed: REPEAT CESAREAN SECTION EDC: 10-22-20  ALLERG: NKDA (N/A )  Patient Location: PACU  Anesthesia Type:Spinal  Level of Consciousness: awake and alert   Airway & Oxygen Therapy: Patient Spontanous Breathing  Post-op Assessment: Report given to RN  Post vital signs: Reviewed  Last Vitals:  Vitals Value Taken Time  BP    Temp    Pulse    Resp    SpO2      Last Pain:  Vitals:   10/22/20 0626  TempSrc:   PainSc: 0-No pain         Complications: No complications documented.

## 2020-10-22 NOTE — Anesthesia Postprocedure Evaluation (Signed)
Anesthesia Post Note  Patient: NEVADA KIRCHNER  Procedure(s) Performed: REPEAT CESAREAN SECTION EDC: 10-22-20  ALLERG: NKDA (N/A )     Patient location during evaluation: PACU Anesthesia Type: Spinal Level of consciousness: awake and alert Pain management: pain level controlled Vital Signs Assessment: post-procedure vital signs reviewed and stable Respiratory status: spontaneous breathing and respiratory function stable Cardiovascular status: blood pressure returned to baseline and stable Postop Assessment: spinal receding Anesthetic complications: no   No complications documented.  Last Vitals:  Vitals:   10/22/20 1043 10/22/20 1142  BP: 117/85 114/79  Pulse: 74 73  Resp: 18 19  Temp: 36.5 C 36.6 C  SpO2: 98% 99%    Last Pain:  Vitals:   10/22/20 1142  TempSrc: Oral  PainSc: 2    Pain Goal: Patients Stated Pain Goal: 2 (10/22/20 1040)                 Kennieth Rad

## 2020-10-23 LAB — CBC
HCT: 36.2 % (ref 36.0–46.0)
Hemoglobin: 11.9 g/dL — ABNORMAL LOW (ref 12.0–15.0)
MCH: 30.7 pg (ref 26.0–34.0)
MCHC: 32.9 g/dL (ref 30.0–36.0)
MCV: 93.3 fL (ref 80.0–100.0)
Platelets: 145 10*3/uL — ABNORMAL LOW (ref 150–400)
RBC: 3.88 MIL/uL (ref 3.87–5.11)
RDW: 13.3 % (ref 11.5–15.5)
WBC: 8.5 10*3/uL (ref 4.0–10.5)
nRBC: 0 % (ref 0.0–0.2)

## 2020-10-23 NOTE — Op Note (Signed)
Jessica Huffman, FERRAIOLO MEDICAL RECORD NO: [id number] ACCOUNT NO: [id number] DATE OF BIRTH: Mar 22, 1991 FACILITY: [facility] LOCATION: [facility] PHYSICIAN: Guy Sandifer. Arleta Creek, MD  Operative Report   DATE OF PROCEDURE: 10/22/2020  PREOPERATIVE DIAGNOSIS:  Previous cesarean section, desires repeat.  POSTOPERATIVE DIAGNOSES:  Previous cesarean section, desires repeat.  PROCEDURE:  Repeat low transverse cesarean section.  ASSISTANT:  Guy Sandifer. Henderson Cloud II, MD  ANESTHESIA:  Spinal.  ESTIMATED BLOOD LOSS:  406 mL.  SPECIMENS:  Placenta to pathology.  FINDINGS:  Viable female infant, birth weight and Apgars pending.  INDICATIONS AND CONSENT:  The patient is a 30 year old G3, P1 at 70 and 0/7 weeks, who desires repeat cesarean section.  Procedure and potential risks and complications have been discussed preoperatively including but not limited to infection, organ  damage, bleeding requiring transfusion of blood products with HIV and hepatitis acquisition, DVT, PE, and pneumonia.  The patient states she understands and agrees and consent is signed on the chart.  DESCRIPTION OF PROCEDURE:  The patient was taken to the operating room where she was identified.  Spinal anesthetic was placed per anesthesiologist and she was placed in the dorsal supine position with a 15-degree left lateral wedge.  She was prepped  vaginally with Betadine.  Foley catheter was placed and prepped abdominally with ChloraPrep.  After 3-minute drying time, timeout was undertaken and then she is draped in a sterile fashion.  After testing for adequate spinal anesthesia, skin was entered  through the Pfannenstiel scar and dissection was carried out in layers to the peritoneum, which was extended superiorly and inferiorly.  Vesicouterine peritoneum was dissected and the bladder flap is developed and the bladder blade was placed.  The  uterus was incised in the low transverse manner and the uterine cavity was entered bluntly with a  hemostat.  The uterine incision was extended bilaterally with the fingers.  Artificial rupture of membranes for clear fluid was carried out.  Vertex  delivered with the aid of a vacuum extractor without difficulty and the baby was delivered.  Good cry and tone was noted.  After 1 minute, the cord was clamped and cut and the baby was handed to waiting pediatrics team.  Placenta was manually delivered  and sent to pathology.  Uterine cavity is clean.  Uterus was closed in a running locking imbricating manner with 2 layers of 0 Monocryl suture.  This achieved good hemostasis.  Lavage was carried out.  Anterior peritoneum was closed in running fashion  with 0 Monocryl suture.  Fascia was closed in a running fashion with 0 looped PDS and the subcutaneous layer was closed with interrupted plain and the skin was closed in a subcuticular fashion with 4-0 Vicryl on a Keith needle.  Benzoin, Steri-Strips and  honeycomb and pressure dressing was applied.  All counts were correct and the patient was taken to recovery room in stable condition.   Xaver.Mink D: 10/22/2020 8:50:50 am T: 10/23/2020 1:43:00 am  JOB: [id number]/ [id number]

## 2020-10-23 NOTE — Progress Notes (Signed)
Subjective: Postpartum Day 1: Cesarean Delivery Patient reports tolerating PO, + flatus and no problems voiding.    Objective: Vital signs in last 24 hours: Temp:  [97.4 F (36.3 C)-98.1 F (36.7 C)] 98 F (36.7 C) (05/14 0451) Pulse Rate:  [67-89] 78 (05/14 0451) Resp:  [13-20] 18 (05/14 0451) BP: (100-125)/(64-95) 116/70 (05/14 0451) SpO2:  [96 %-100 %] 100 % (05/14 0451)  Physical Exam:  General: alert, cooperative and no distress Lochia: appropriate Uterine Fundus: firm Incision: healing well DVT Evaluation: No evidence of DVT seen on physical exam.  Recent Labs    10/20/20 0909 10/23/20 0552  HGB 12.7 11.9*  HCT 38.6 36.2    Assessment/Plan: Status post Cesarean section. Doing well postoperatively.  Continue current care.  Jessica Huffman II 10/23/2020, 7:48 AM

## 2020-10-24 MED ORDER — IBUPROFEN 600 MG PO TABS
600.0000 mg | ORAL_TABLET | Freq: Four times a day (QID) | ORAL | Status: DC | PRN
  Administered 2020-10-24 – 2020-10-25 (×2): 600 mg via ORAL
  Filled 2020-10-24 (×2): qty 1

## 2020-10-24 NOTE — Progress Notes (Signed)
Subjective: Postpartum Day 2: Cesarean Delivery Patient reports tolerating PO, + flatus and no problems voiding.    Objective: Vital signs in last 24 hours: Temp:  [97.9 F (36.6 C)-98.4 F (36.9 C)] 97.9 F (36.6 C) (05/15 0557) Pulse Rate:  [69-81] 78 (05/15 0557) Resp:  [18-20] 20 (05/15 0557) BP: (113-118)/(69-78) 113/71 (05/15 0557) SpO2:  [99 %-100 %] 99 % (05/15 0557)  Physical Exam:  General: alert, cooperative and no distress Lochia: appropriate Uterine Fundus: firm Incision: healing well DVT Evaluation: No evidence of DVT seen on physical exam.  Recent Labs    10/23/20 0552  HGB 11.9*  HCT 36.2    Assessment/Plan: Status post Cesarean section. Doing well postoperatively.  Continue current care.  Roselle Locus II 10/24/2020, 7:59 AM

## 2020-10-24 NOTE — Clinical Social Work Maternal (Signed)
CLINICAL SOCIAL WORK MATERNAL/CHILD NOTE  Patient Details  Name: Jessica Huffman MRN: [id number] Date of Birth: May 10, 1991  Date:  10/24/2020  Clinical Social Worker Initiating Note:  Darcus Austin, MSW, LCSWA Date/Time: Initiated:  10/24/20/1311     Child's Name:  Hettie Holstein   Biological Parents:  Mother,Father Erlene Quan [place], 09/25/83, [phone])   Need for Interpreter:  None   Reason for Referral:  Other (Comment) (Methadone 90mg )   Address:  [place] [address]    Phone number:  [phone] (home)     Additional phone number:   Household Members/Support Persons (HM/SP):   Household Member/Support Person 1,Household Member/Support Person 2,Household Member/Support Person 3   HM/SP Name Relationship DOB or Age  HM/SP -[place] FOB 09/25/83  HM/SP -2 Larey Days Son 03/21/17  HM/SP -3 Chloe Trenton Gammon FOB's daughter (comes on weekends) 12/05/09  HM/SP -4        HM/SP -5        HM/SP -6        HM/SP -7        HM/SP -8          Natural Supports (not living in the home):  Immediate Family,Spouse/significant other,Extended Family ([name] reported, FOB, both her and FOB's mother.)   Professional Supports:     Employment: Animator   Type of Work: Librarian, academic at Education officer, environmental   Education:  [employer] arranged:    Museum/gallery curator Resources:  Radio producer)   Other Resources:  Physicist, medical    Cultural/Religious Considerations Which May Impact Care:    Strengths:  Ability to meet basic needs ,Home prepared for child ,Pediatrician chosen   Psychotropic Medications:         Pediatrician:    [place] area  Pediatrician List:   [facility])  [place]      Pediatrician Fax Number:    Risk Factors/Current Problems:  Other(Comment) (Methadone 90mg )   Cognitive State:  Able to Concentrate ,Alert ,Goal Oriented  ,Insightful ,Linear Thinking    Mood/Affect:  Comfortable ,Interested ,Relaxed ,Bright ,Calm ,Happy    CSW Assessment: CSW met with [name] to complete assessment for hx of substance use. CSW observed [name] resting in bed with infant while FOB was at bedside. [name] gave CSW verbal consent to complete assessment while FOB was present. CSW explained role and reason for consult. [name] was pleasant, polite and engaged with CSW. 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 [name] reported, she has been prescribe methadone 90mg  at [facility] and it has been managing symptoms. [name] denied any other illicit substances or CPS involvement.    CSW informed [name] of Drug Screen Policy and [name] was understanding of protocol. CSW will continue to follow the CDS and will make [facility] CPS report if warranted.  CSW provided education regarding the baby blues period vs. perinatal mood disorders, discussed treatment and gave resources for mental health follow up if concerns arise. CSW recommends self- evaluation during the postpartum time period using the New Mom Checklist from Postpartum Progress and encouraged [name] to contact a medical professional if symptoms are noted at any time.   When CSW asked [name] about her emotion after delivery. [name] reported, she is feeling "good mentally and sore physically". [name] identified, FOB, both her and FOB's mother as  supports. [name] denied SI and HI when CSW assessed for safety.    [name] reported, she receive FS, but does not receive WIC. CSW informed [name] about adding infant to [facility] application and possibly WIC. [name] reported, infant's pediatrician will be at [facility] and there are no barriers to follow up care. [name] reported, she has all essentials needed to care for infant. [name] reported, infant has a new car seat, crib and bassinet. [name] denied any additional barriers.     CSW provided education on Sudden Infant Death Syndrome (SIDS).    CSW  will continue to follow the CDS and will make [facility] CPS report if warranted.  CSW Plan/Description:  No Further Intervention Required/No Barriers to [place] Will Continue to Monitor Umbilical Cord Tissue Drug Screen Results and Make Report if [facility] Drug Screen Policy Information,Perinatal Mood and Anxiety Disorder (PMADs) Education,Sudden Infant Death Syndrome (SIDS) Education   Darcus Austin, MSW, LCSW-A Clinical Social Worker [phone]   Darcus Austin, Latanya Presser 10/24/2020, 1:21 PM

## 2020-10-25 LAB — SURGICAL PATHOLOGY

## 2020-10-25 MED ORDER — IBUPROFEN 600 MG PO TABS: 600.0000 mg | ORAL_TABLET | Freq: Four times a day (QID) | ORAL | 0 refills | Status: DC | PRN

## 2020-10-25 MED ORDER — OXYCODONE HCL 5 MG PO TABS: 5.0000 mg | ORAL_TABLET | ORAL | 0 refills | Status: DC | PRN

## 2020-10-25 NOTE — Discharge Summary (Signed)
Postpartum Discharge Summary  Date of Service Oct 25, 2020     Patient Name: Jessica Huffman DOB: [birth year] MRN: [id number]  Date of admission: 10/22/2020 Delivery date:10/22/2020  Delivering provider: Everlene Farrier  Date of discharge: 10/25/2020  Admitting diagnosis: History of cesarean section [Z98.891] Intrauterine pregnancy: [redacted]w[redacted]d    Secondary diagnosis:  Active Problems:   History of cesarean section  Additional problems: none    Discharge diagnosis: Term Pregnancy Delivered                                              Post partum procedures:none Augmentation: N/A Complications: None  Hospital course: Onset of Labor With Unplanned C/S   30y.o. yo [id number] 450w0das admitted in Active Labor on 10/22/2020. Patient had a labor course significant for labor. The patient went for cesarean section due to Elective Repeat. Delivery details as follows: Membrane Rupture Time/Date: 8:08 AM ,10/22/2020   Delivery Method:C-Section, Vacuum Assisted  Details of operation can be found in separate operative note. Patient had an uncomplicated postpartum course.  She is ambulating,tolerating a regular diet, passing flatus, and urinating well.  Patient is discharged home in stable condition 10/25/20.  Newborn Data: Birth date:10/22/2020  Birth time:8:09 AM  Gender:Female  Living status:Living  Apgars:9 ,10  Weight:3855 g   Magnesium Sulfate received: No BMZ received: No Rhophylac:No MMR:No T-DaP:Given prenatally Flu: No Transfusion:No  Physical exam  Vitals:   10/24/20 0557 10/24/20 1510 10/24/20 2203 10/25/20 0536  BP: 113/71 125/78 120/79 118/74  Pulse: 78 87 85 92  Resp: _0 Temp: 97.9 F (36.6 C) 98.2 F (36.8 C) 98.2 F (36.8 C) 98.2 F (36.8 C)  TempSrc: Oral Oral Oral Oral  SpO2: 99%  100% 99%  Weight:      Height:       General: alert, cooperative and no distress Lochia: appropriate Uterine Fundus: soft Incision: Healing well with no significant  drainage DVT Evaluation: No evidence of DVT seen on physical exam. Labs: Lab Results  Component Value Date   WBC 8.5 10/23/2020   HGB 11.9 (L) 10/23/2020   HCT 36.2 10/23/2020   MCV 93.3 10/23/2020   PLT 145 (L) 10/23/2020   CMP Latest Ref Rng & Units 02/21/2017  Glucose 65 - 99 mg/dL 94  BUN 6 - 20 mg/dL 7  Creatinine 0.57 - 1.00 mg/dL 0.69  Sodium 134 - 144 mmol/L 137  Potassium 3.5 - 5.2 mmol/L 4.8  Chloride 96 - 106 mmol/L 102  CO2 20 - 29 mmol/L 20  Calcium 8.7 - 10.2 mg/dL 8.7  Total Protein 6.0 - 8.5 g/dL 5.9(L)  Total Bilirubin 0.0 - 1.2 mg/dL 0.2  Alkaline Phos 39 - 117 IU/L 99  AST 0 - 40 IU/L 26  ALT 0 - 32 IU/L 15   Edinburgh Score: Edinburgh Postnatal Depression Scale Screening Tool 10/23/2020  I have been able to laugh and see the funny side of things. 0  I have looked forward with enjoyment to things. 0  I have blamed myself unnecessarily when things went wrong. 1  I have been anxious or worried for no good reason. 1  I have felt scared or panicky for no good reason. 0  Things have been getting on top of me. 1  I have been so unhappy that I have had difficulty  sleeping. 0  I have felt sad or miserable. 0  I have been so unhappy that I have been crying. 1  The thought of harming myself has occurred to me. 0  Edinburgh Postnatal Depression Scale Total 4      After visit meds:  Allergies as of 10/25/2020   No Known Allergies     Medication List    TAKE these medications   calcium carbonate 500 MG chewable tablet Commonly known as: TUMS - dosed in mg elemental calcium Chew 2 tablets by mouth 3 (three) times daily as needed for indigestion or heartburn.   ibuprofen 600 MG tablet Commonly known as: ADVIL Take 1 tablet (600 mg total) by mouth every 6 (six) hours as needed for moderate pain.   methadone 10 MG/ML solution Commonly known as: DOLOPHINE Take 90 mg by mouth daily.   oxyCODONE 5 MG immediate release tablet Commonly known as: Oxy  IR/ROXICODONE Take 1-2 tablets (5-10 mg total) by mouth every 4 (four) hours as needed for moderate pain.   PRENATAL ADULT GUMMY/DHA/FA PO Take 2 tablets by mouth daily.            Discharge Care Instructions  (From admission, onward)         Start     Ordered   10/25/20 0000  Leave dressing on - Keep it clean, dry, and intact until clinic visit        10/25/20 0848           Discharge home in stable condition Infant Feeding: Breast Infant Disposition:rooming in Discharge instruction: per After Visit Summary and Postpartum booklet. Activity: Advance as tolerated. Pelvic rest for 6 weeks.  Diet: routine diet Anticipated Birth Control: Unsure Postpartum Appointment:1 week Additional Postpartum F/U: Incision check 1 week Future Appointments:No future appointments. Follow up Visit:      10/25/2020 Cyril Mourning, MD

## 2020-10-25 NOTE — Lactation Note (Signed)
This note was copied from a baby's chart. Lactation Consultation Note  Patient Name: Jessica Huffman [id number] Date: 10/25/2020 Reason for consult: Follow-up assessment;Other (Comment) (ESC baby) Age:32 hours  0840 - 8676 - I followed up with [name] and her 32 hour old daughter, Jessica Huffman. She reports that Jessica Huffman is latching well. Her milk is transitioning and she recently pumped 45 mls of breast milk. She has been supplementing Jessica Huffman with a higher calorie ABM due to provider order.   [name] states that baby will stay until Tuesday for monitoring She states that baby is doing well however and soothes well. They are using a pacifier as needed to help with soothing/sucking needs.  Baby Jessica Huffman has gained weight overnight.    [name] is using the [employer] DEBP. She has private insurance and plans to call to see if she can obtain a personal pump. She used [facility] with her first child, but she has not signed up with baby Jessica Huffman.  [name] has enrolled [name] for follow up at [facility].  I recommended breast feeding first and then supplementing after. I encouraged her to feed her EBM and continue to do some pumping.   [name] states that she is doing well, and she will call lactation if she needs follow up. I placed her status as PRN.   Maternal Data Does the patient have breastfeeding experience prior to this delivery?: Yes How long did the patient breastfeed?: pumped for 6 months  Feeding Mother's Current Feeding Choice: Breast Milk and Formula Nipple Type: Extra Slow Flow   Lactation Tools Discussed/Used Breast pump type: Double-Electric Breast Pump;Manual Pump Education: Setup, frequency, and cleaning Reason for Pumping: supplementation Pumping frequency:  (did not state) Pumped volume: 45 mL (mls)  Interventions Interventions: Breast feeding basics reviewed;Education  Discharge    Consult Status Consult Status: PRN Follow-up type: Call as  needed    Walker Shadow 10/25/2020, 8:50 AM

## 2020-10-26 ENCOUNTER — Ambulatory Visit: Payer: Self-pay

## 2020-10-26 NOTE — Lactation Note (Signed)
This note was copied from a baby's chart. Lactation Consultation Note  Patient Name: Jessica Huffman [id number] Date: 10/26/2020 Reason for consult: Follow-up assessment;Term Age:32 days  Lactation requested by Ms. Shock. She states that baby is not latching as well today. She is now pumping about 40 - 45 mls and mostly feeding her EBM. We discussed methods for bringing baby back to the breast.  Baby just fed and had close to 30 mls by bottle. However, she was still cueing. I assisted with placing baby in cross cradle hold on the right breast. Baby latched with rhythmic suckling sequences.  Ms. Speaker appeared pleased. We discussed providing earlier opportunities for baby to latch prior to getting frustrated. I also recommended doing lots of STS and opportunities to latch prior to supplementing.  I also advised that she go to an OP appointment. She consented to a referral, and I placed one in Epic.   Plan: Breast feed first, using both breasts 8-12 times a day. Supplement after as needed. Post pump to obtain EBM for supplementation purposes.   Maternal Data Has patient been taught Hand Expression?: Yes  Feeding Mother's Current Feeding Choice: Breast Milk and Formula Nipple Type: Extra Slow Flow  LATCH Score Latch: Grasps breast easily, tongue down, lips flanged, rhythmical sucking.  Audible Swallowing: A few with stimulation  Type of Nipple: Everted at rest and after stimulation  Comfort (Breast/Nipple): Soft / non-tender  Hold (Positioning): No assistance needed to correctly position infant at breast.  LATCH Score: 9   Lactation Tools Discussed/Used Tools:  (requested a size 24 NS) Breast pump type: Double-Electric Breast Pump  Interventions Interventions: Breast feeding basics reviewed;Assisted with latch;Skin to skin;Education;Breast compression  Discharge Discharge Education: Outpatient recommendation;Outpatient Epic message sent;Warning signs for feeding  baby  Consult Status Consult Status: Complete Date: 10/26/20 Follow-up type: In-patient    Walker Shadow 10/26/2020, 11:02 AM

## 2020-10-26 NOTE — Addendum Note (Signed)
Addendum  created 10/26/20 1603 by Marcene Duos, MD   Intraprocedure Staff edited

## 2022-10-14 IMAGING — US US MFM OB DETAIL+14 WK
1 series · 14 of 28 positions shown · non-contrast
Comparison: none

[Series 1: us mfm ob detail+14 wk · 14 of 133 slices shown]
[im 5/133]
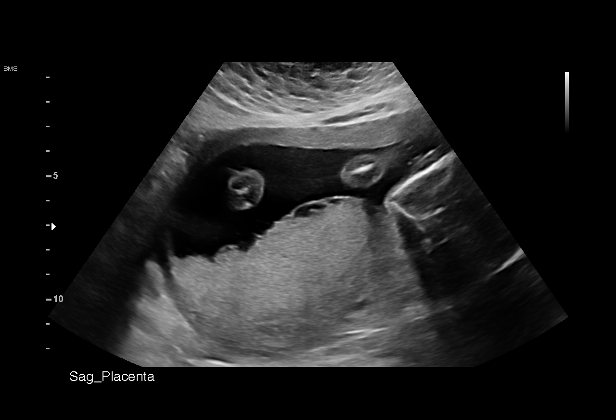
[im 15/133]
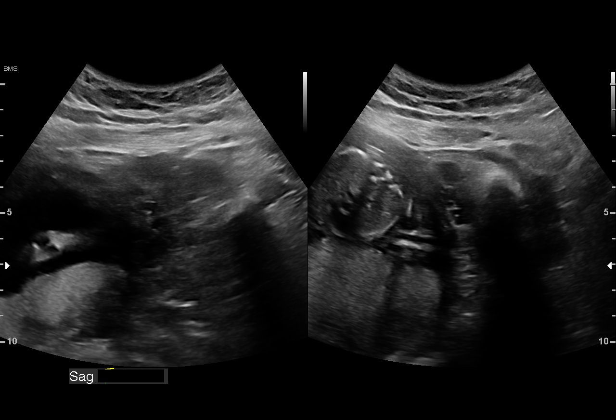
[im 25/133]
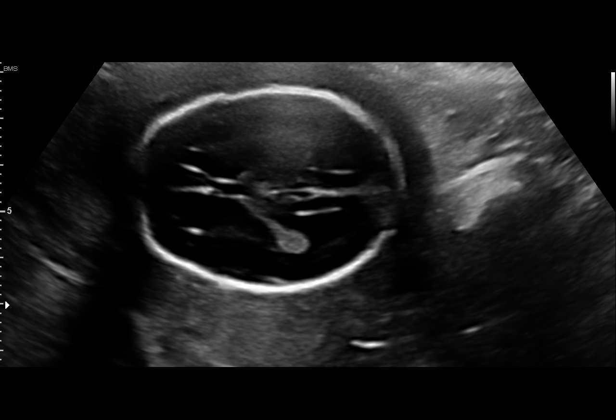
[im 35/133]
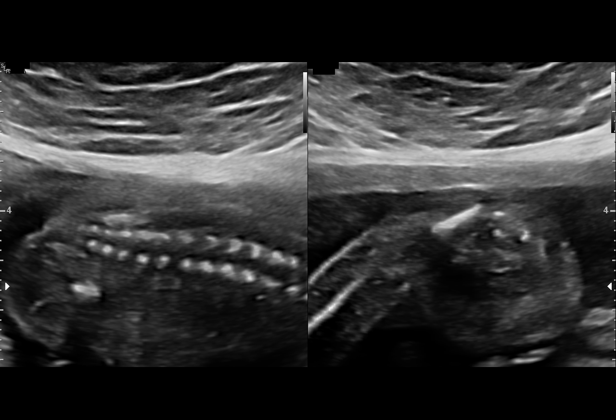
[im 45/133]
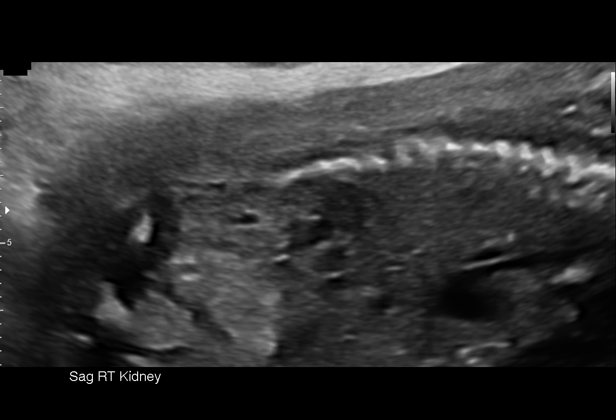
[im 54/133]
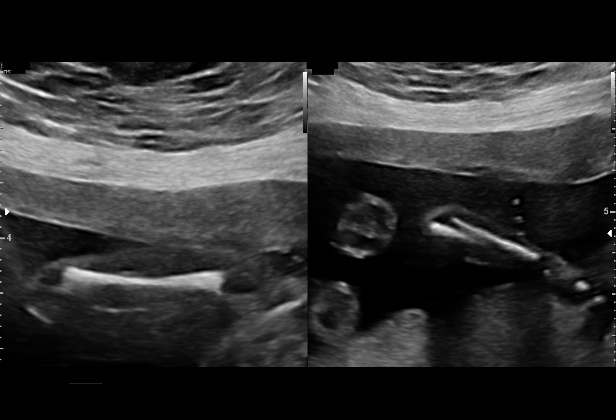
[im 64/133]
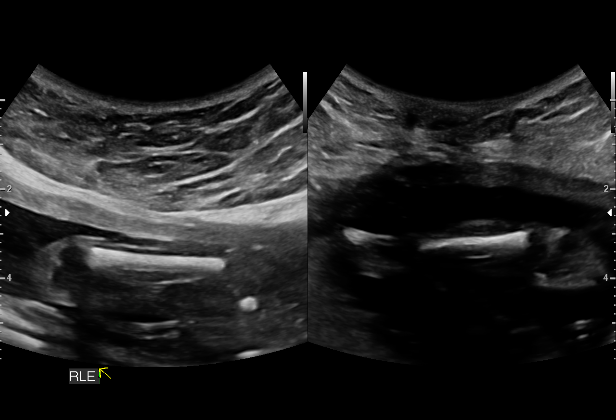
[im 74/133]
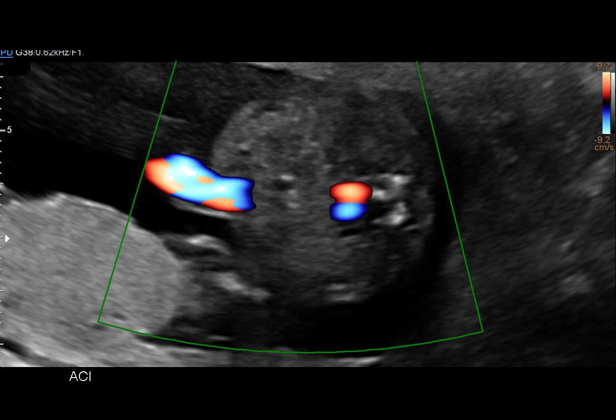
[im 84/133]
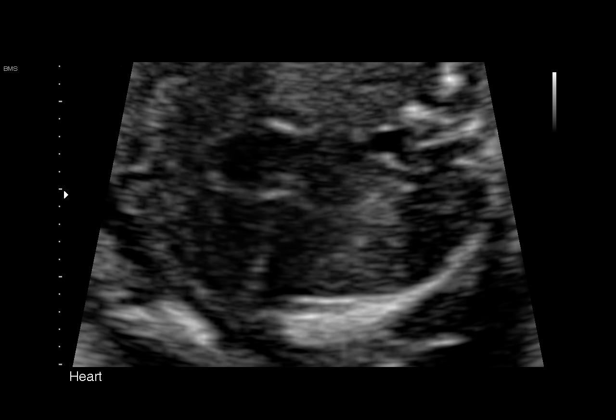
[im 93/133]
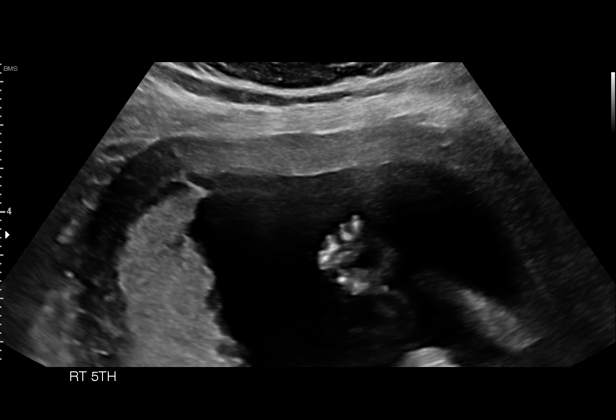
[im 103/133]
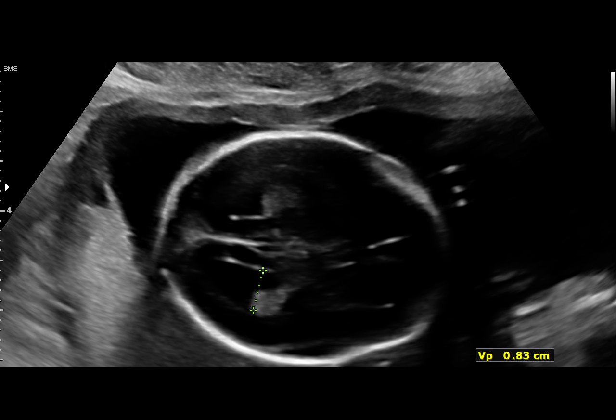
[im 113/133]
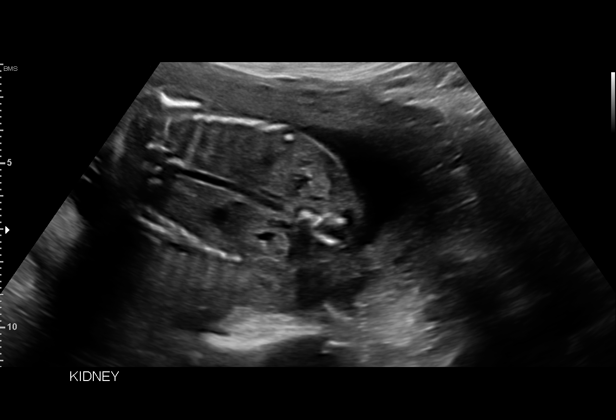
[im 123/133]
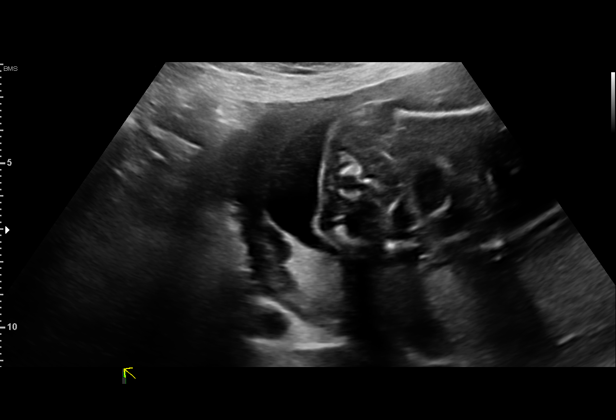
[im 133/133]
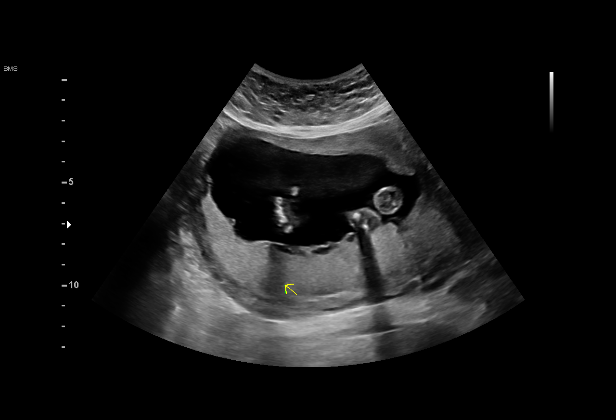

[14 of 28 positions shown; findings below may reference images not displayed]

1  US MFM OB DETAIL +14 WK               76811.01    DMITRI CURRY

Indications

 Encounter for antenatal screening for
 malformations (Neg AFP)
 Heroin/Methadone use
 Previous cesarean delivery, antepartum
 Tobacco use complicating pregnancy,
 second trimester
 19 weeks gestation of pregnancy
Fetal Evaluation

 Num Of Fetuses:         1
 Fetal Heart Rate(bpm):  132
 Cardiac Activity:       Observed
 Presentation:           Cephalic
 Placenta:               Posterior
 P. Cord Insertion:      Visualized, central

 Amniotic Fluid
 AFI FV:      Within normal limits

                             Largest Pocket(cm)

Biometry

 BPD:      42.5  mm     G. Age:  18w 6d         17  %    CI:        70.18   %    70 - 86
                                                         FL/HC:      17.8   %    16.8 -
 HC:      161.8  mm     G. Age:  19w 0d         13  %    HC/AC:      1.11        1.09 -
 AC:      146.4  mm     G. Age:  20w 0d         52  %    FL/BPD:     67.8   %
 FL:       28.8  mm     G. Age:  18w 6d         15  %    FL/AC:      19.7   %    20 - 24
 HUM:      29.5  mm     G. Age:  19w 5d         51  %
 CER:      18.2  mm     G. Age:  18w 0d         11  %
 NFT:       3.8  mm

 LV:        7.8  mm
 CM:        3.5  mm

 Est. FW:     289  gm    0 lb 10 oz      27  %
OB History

 Gravidity:    3
 Living:       1
Gestational Age

 Clinical EDD:  19w 5d                                        EDD:   10/22/20
 U/S Today:     19w 1d                                        EDD:   10/26/20
 Best:          19w 5d     Det. By:  Clinical EDD             EDD:   10/22/20
Anatomy

 Cranium:               Appears normal         LVOT:                   Not well visualized
 Cavum:                 Appears normal         Aortic Arch:            Appears normal
 Ventricles:            Appears normal         Ductal Arch:            Appears normal
 Choroid Plexus:        Appears normal         Diaphragm:              Appears normal
 Cerebellum:            Appears normal         Stomach:                Appears normal, left
                                                                       sided
 Posterior Fossa:       Appears normal         Abdomen:                Appears normal
 Nuchal Fold:           Appears normal         Abdominal Wall:         Appears nml (cord
                                                                       insert, abd wall)
 Face:                  Appears normal         Cord Vessels:           Appears normal (3
                        (orbits and profile)                           vessel cord)
 Lips:                  Appears normal         Kidneys:                Appear normal
 Palate:                Not well visualized    Bladder:                Appears normal
 Thoracic:              Appears normal         Spine:                  Appears normal
 Heart:                 Not well visualized    Upper Extremities:      Appears normal
 RVOT:                  Not well visualized    Lower Extremities:      Appears normal

 Other:  Heels/feet and 5th digits visualized. Fetus appears to be female.
Cervix Uterus Adnexa

 Cervix
 Length:           3.38  cm.
 Normal appearance by transabdominal scan.

 Uterus
 No abnormality visualized.

 Right Ovary
 Not visualized.

 Left Ovary
 Not visualized.
 Cul De Sac
 No free fluid seen.

 Adnexa
 No abnormality visualized.
Impression

 We performed fetal anatomy scan.  No markers of
 aneuploidies or fetal structural defects are seen.  Fetal
 biometry is consistent with the previously established dates.
 Amniotic fluid is normal and good fetal activity seen.  Cardiac
 views were limited because of fetal position.
 xxxxxxxxxxxxxxxxxxxxxxxxxxxxxxxxxxxxxxxxxx
 Consultation Note ([REDACTED])

 I had the pleasure of seeing Ms. Maamito at the [facility]
 [facility]  She is 21Q11 at 19w 5d gestation. She is
 here for fetal anatomy scan and for consultation.  Her
 problems include:

 -Methadone maintenance.
 -Previous cesarean delivery.
 Patient admits to using heroin in 8195, and methadone was
 initiated in her pregnancy.  She has been in remission and
 has been taking methadone regularly.  Currently, she takes
 methadone 75 mg daily and does not experience withdrawal
 symptoms.  She reports her withdrawal symptoms consists
 watery eyes and feeling restless.
 Patient has screened negative on urine toxicology for almost
 4 years.  She does not have to make daily visits but gets
 monthly supply of methadone.

 She does not have diabetes or hypertension or any other
 chronic medical conditions.
 Past surgical history: Cesarean section
 Medications: Prenatal vitamins, methadone.
 Allergies: No known drug allergies.
 Social history: She smokes 2 cigarettes daily.  Denies alcohol
 or drug use.  She lives with her partner with the father of her
 first baby.  He is Caucasian and he is in good health.  Patient
 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
 Family history: Father had DVT.  He died of throat cancer in
 8195 at age 66 years.  Her mother is in good health.

 Obstetric history significant for a term cesarean delivery
 (failure to progress in labor) of a male infant (Doe) weighing
 7 pounds 9 ounces at birth.
 In early 6561, she had an early spontaneous miscarriage at
 12 weeks.

 GYN history: No history of abnormal Pap smears or cervical
 surgeries.  No history of breast disease.  Her previous
 menstrual cycles were reportedly regular.
 Prenatal course.  Patient reports on first trimester screening,
 the risks of aneuploidies are not increased.  MSAFP
 screening reportedly showed low risk for open spina bifida.

 Ultrasound
 We performed fetal anatomy scan.  No markers of
 aneuploidies or fetal structural defects are seen.  Fetal
 biometry is consistent with the previously established dates.
 Amniotic fluid is normal and good fetal activity seen.  Cardiac
 views were limited because of fetal position.

 Our concerns include:
 Methadone Maintenance Treatment
 - Methadone is one of the first drugs used in treating opioid
 addiction in pregnancy. It is a synthetic opioid and is a full
 agonist. Opioid withdrawal symptoms are improved with
 methadone. It also acts as an analgesic.
 -It is category C drug.
 -Complications (heroin use) include increase in stillbirth (16-
 fold increase), placental abruption, preterm delivery, fetal
 growth restriction and chorioamnionitis.
 -Pregnancies managed with methadone have better
 outcomes than pregnancies with illicit drug use.
 -Placenta metabolizes methadone and increasing dosage
 may be required in the second and third trimesters.
 -Neonatal withdrawal is a disorder of gastrointestinal,
 respiratory and central nervous systems. Autonomic
 symptoms and signs predominate. Methadone dose during
 pregnancy appears to be unrelated to the severity of
 withdrawal and the need to treat infants. About 50% to 80%
 of infants exposed to methadone in utero have clinical
 features suggestive of neonatal withdrawal, and up to [DATE] require treatment. Symptoms include nasal stuffiness,
 sneezing, diarrhea, tremors, irritability, hyperactive reflexes,
 and poor feeding.
 Buprenorphine is an alternative medication. It is an opioid
 with mixed agonist-antagonist activity. In patient who do not
 have withdrawal symptoms, initiation of Buprenorphine can
 precipitate withdrawal symptoms. Because of this, some
 patients may resort to illicit drug use. Its main advantage is
 that it office-based therapy avoids the need for daily visits.
 I reassured the patient of the safety of methadone and
 congratulated her on remission.
 I recommend serial fetal growth assessments till delivery. If
 the patient continues to smoke cigarettes, I recommend
 weekly BPP from 36 weeks till delivery.
 Cigarette Smoking in Pregnancy
 Smoking increases the risk of maternal complications that
 include placental abruption and placenta previa. Fetal
 complications include fetal growth restriction, prematurity,
 and stillbirth.
 Smoking cessation at any gestational age is beneficial and
 should routinely be addressed in her prenatal visits. Nicotine-
 replacement therapy (MONDEN) can be considered if
 nonpharmacological approaches have failed. Its chief benefit
 is that it does not contain other toxic substances present in
 cigarettes. Cigarettes, in addition to nicotine and Carbon
 monoxide, contain several other toxic substances.
 Transdermal patch of 21 mg/day for 2 weeks followed by 14
 mg/day for 1 to 2 weeks and then 7 mg/day (maintenance)
 can be considered in her case. Overall, the benefit of MONDEN
 outweighs cigarette smoking as cigarettes contain several
 other compounds including carcinogens that are harmful to
 her and her pregnancy.
 Previous cesarean delivery
 We discussed VBAC and its benefits. Repeat cesarean
 deliveries increase the likelihood of placenta previa or
 placenta accreta spectrum (PAS). Patient is keen on repeat
 cesarean delivery.
Recommendations

 -An appointment was made for her to return in 4 weeks for
 completion of fetal anatomy (cardiac views).
 -Serial fetal growth assessments till delivery and they may be
 performed at your office.
 -If patient continues to smoke cigarettes, we recommend
 weekly BPP from 36 weeks gestation till delivery.
                 Rose, Ligaya

## 2023-08-08 LAB — HEPATITIS C ANTIBODY: HCV Ab: NEGATIVE

## 2023-08-08 LAB — OB RESULTS CONSOLE ANTIBODY SCREEN: Antibody Screen: NEGATIVE

## 2023-08-08 LAB — OB RESULTS CONSOLE HEPATITIS B SURFACE ANTIGEN: Hepatitis B Surface Ag: NEGATIVE

## 2023-08-08 LAB — OB RESULTS CONSOLE HIV ANTIBODY (ROUTINE TESTING): HIV: NONREACTIVE

## 2023-08-08 LAB — OB RESULTS CONSOLE RUBELLA ANTIBODY, IGM: Rubella: IMMUNE

## 2023-08-08 LAB — OB RESULTS CONSOLE RPR: RPR: NONREACTIVE

## 2023-08-09 ENCOUNTER — Other Ambulatory Visit: Payer: Self-pay | Admitting: Obstetrics and Gynecology

## 2023-08-09 DIAGNOSIS — F1120 Opioid dependence, uncomplicated: Secondary | ICD-10-CM

## 2023-08-17 LAB — OB RESULTS CONSOLE GC/CHLAMYDIA
Chlamydia: NEGATIVE
Neisseria Gonorrhea: NEGATIVE

## 2023-08-23 ENCOUNTER — Encounter: Payer: Self-pay | Admitting: *Deleted

## 2023-08-23 DIAGNOSIS — O34219 Maternal care for unspecified type scar from previous cesarean delivery: Secondary | ICD-10-CM | POA: Insufficient documentation

## 2023-08-23 DIAGNOSIS — O09529 Supervision of elderly multigravida, unspecified trimester: Secondary | ICD-10-CM | POA: Insufficient documentation

## 2023-08-31 ENCOUNTER — Other Ambulatory Visit: Payer: Self-pay | Admitting: *Deleted

## 2023-08-31 ENCOUNTER — Encounter: Payer: Self-pay | Admitting: *Deleted

## 2023-08-31 ENCOUNTER — Ambulatory Visit (HOSPITAL_BASED_OUTPATIENT_CLINIC_OR_DEPARTMENT_OTHER): Payer: 59 | Admitting: Maternal & Fetal Medicine

## 2023-08-31 ENCOUNTER — Ambulatory Visit: Payer: 59 | Attending: Obstetrics and Gynecology

## 2023-08-31 ENCOUNTER — Ambulatory Visit: Payer: 59 | Admitting: *Deleted

## 2023-08-31 VITALS — BP 131/90 | HR 104

## 2023-08-31 DIAGNOSIS — Z98891 History of uterine scar from previous surgery: Secondary | ICD-10-CM | POA: Diagnosis present

## 2023-08-31 DIAGNOSIS — F1111 Opioid abuse, in remission: Secondary | ICD-10-CM | POA: Insufficient documentation

## 2023-08-31 DIAGNOSIS — Z79891 Long term (current) use of opiate analgesic: Secondary | ICD-10-CM | POA: Diagnosis present

## 2023-08-31 DIAGNOSIS — Z3A14 14 weeks gestation of pregnancy: Secondary | ICD-10-CM | POA: Insufficient documentation

## 2023-08-31 DIAGNOSIS — O99322 Drug use complicating pregnancy, second trimester: Secondary | ICD-10-CM | POA: Diagnosis not present

## 2023-08-31 DIAGNOSIS — F1120 Opioid dependence, uncomplicated: Secondary | ICD-10-CM | POA: Diagnosis not present

## 2023-08-31 DIAGNOSIS — O09522 Supervision of elderly multigravida, second trimester: Secondary | ICD-10-CM

## 2023-08-31 DIAGNOSIS — O34219 Maternal care for unspecified type scar from previous cesarean delivery: Secondary | ICD-10-CM | POA: Diagnosis not present

## 2023-08-31 NOTE — Progress Notes (Signed)
 Patient information  Patient Name: Jessica Huffman  Patient MRN:   [id number]  Referring practice: MFM Referring Provider: [facility]  MFM CONSULT  Jessica Huffman is a 33 y.o. [id number] at [redacted]w[redacted]d here for ultrasound and consultation. Patient Active Problem List   Diagnosis Date Noted   Pregnancy with history of cesarean section, antepartum(x2) 08/23/2023   AMA (advanced maternal age) multigravida 35+ 08/23/2023   History of cesarean section 10/22/2020   History of heroin abuse (HCC) 12/22/2016   Female genital lesion 12/22/2016    Jessica Huffman has a pregnancy with the complications mentioned in the problem list. During today's visit we focused on the following concerns:   RE opiate maintenance: The patient has a h/o opiate abuse but has been on maintenance with methadone for years and is doing well.  We discussed the potential complications during pregnancy and postpartum.  Overall pregnancy is complicated by opiate use doing well for relapse of illicit substances or complications with tobacco or alcohol.  Patient reports that she has been on opiate maintenance without either other substance for years.  Her last pregnancy was complicated by methadone use as well and she knows to expect some degree of neonatal abstinence.  We also discussed the possibility of growth complications and need for future ultrasounds.  We also discussed the need for potential dose adjustment and increase in the third trimester due to the metabolic demands of pregnancy.  RE prior cesarean delivery x 2: Today the placenta appears to be anterior/fundal above the area of her lower uterine scar.  I discussed we will look at this again at her next ultrasound to assess for any evidence of placenta accreta.  I reassured her at this time there is no evidence of accreta.  Sonographic findings Single intrauterine pregnancy at 14w 6d. Fetal cardiac activity:  Observed and appears normal. Presentation: Transverse, head  to maternal left. The anatomic structures that were well seen appear normal but is very limited due to early gestational age. Fetal biometry shows the estimated fetal weight at the 39 percentile. Amniotic fluid: Within normal limits.  Placenta: Anterior. Adnexa: No abnormality visualized.  There are limitations of prenatal ultrasound such as the inability to detect certain abnormalities due to poor visualization. Various factors such as fetal position, gestational age and maternal body habitus may increase the difficulty in visualizing the fetal anatomy.    Recommendations -EDD should be 02/23/2024 based on  Early Ultrasound  (07/25/23). -Follow up ultrasound in 4-6 weeks to reassess the fetal growth - will also look at placenta and prior CD scar again -Serial growth ultrasounds starting around 28 weeks to monitor for fetal growth restriction -Continue Methadone under prescribing physicians guidance - may need higher doses in the third trimester -Notify NICU about opiate exposure -Breastfeeding encouraged -Continue routine prenatal care with referring OB provider  Review of Systems: A review of systems was performed and was negative except per HPI   Vitals and Physical Exam    08/31/2023    1:56 PM 08/31/2023    1:14 PM 10/25/2020    5:36 AM  Vitals with BMI  Systolic 124 131 413  Diastolic 80 90 74  Pulse  104 92    Sitting comfortably on the sonogram table Nonlabored breathing Normal rate and rhythm Abdomen is nontender  Past pregnancies OB History  Gravida Para Term Preterm AB Living  4 2 2  0 1 2  SAB IAB Ectopic Multiple Live Births  0 1 0 0  2    # Outcome Date GA Lbr Len/2nd Weight Sex Type Anes PTL Lv  4 Current           3 Term 10/22/20 [redacted]w[redacted]d  8 lb 8 oz (3.855 kg) F CS-Vac Spinal  LIV  2 Term 03/21/17 [redacted]w[redacted]d  7 lb 8.1 oz (3.405 kg) M CS-LTranv EPI  LIV  1 IAB 2007             I spent 30 minutes reviewing the patients chart, including labs and images as well as  counseling the patient about her medical conditions. Greater than 50% of the time was spent in direct face-to-face patient counseling.  Braxton Feathers, DO Maternal fetal medicine, [place]   08/31/2023  2:03 PM

## 2023-10-02 DIAGNOSIS — F1120 Opioid dependence, uncomplicated: Secondary | ICD-10-CM

## 2023-10-02 HISTORY — DX: Opioid dependence, uncomplicated: F11.20

## 2023-10-03 ENCOUNTER — Telehealth (HOSPITAL_BASED_OUTPATIENT_CLINIC_OR_DEPARTMENT_OTHER): Admitting: *Deleted

## 2023-10-03 DIAGNOSIS — Z79891 Long term (current) use of opiate analgesic: Secondary | ICD-10-CM

## 2023-10-03 NOTE — Progress Notes (Signed)
 New OB Intake  I connected with Jessica Huffman  on 10/03/23 at 0805 by phone and verified that I am speaking with the correct person using two identifiers. Nurse is located at [facility] and pt is located at home.   I explained I am completing New Patient Intake today. We discussed EDD of 02/23/2024, by Ultrasound. Pt is [id number]. I reviewed her allergies, medications and Medical/Surgical/OB history.    Problem List There are no diagnoses linked to this encounter.  OB History  Gravida Para Term Preterm AB Living  4 2 2  0 1 2  SAB IAB Ectopic Multiple Live Births  0 1 0 0 2    # Outcome Date GA Lbr Len/2nd Weight Sex Type Anes PTL Lv  4 Current           3 Term 10/22/20 [redacted]w[redacted]d  8 lb 8 oz (3.855 kg) F CS-Vac Spinal  LIV  2 Term 03/21/17 [redacted]w[redacted]d  7 lb 8.1 oz (3.405 kg) M CS-LTranv EPI  LIV  1 IAB 2007             Past Medical History:  Diagnosis Date   History of heroin abuse [facility])        Past Surgical History:  Procedure Laterality Date   CESAREAN SECTION N/A 03/21/2017   Procedure: CESAREAN SECTION;  Surgeon: Wendelyn Halter, MD;  Location: [facility] BIRTHING SUITES;  Service: Obstetrics;  Laterality: N/A;   CESAREAN SECTION N/A 10/22/2020   Procedure: REPEAT CESAREAN SECTION EDC: 10-22-20  ALLERG: NKDA;  Surgeon: Thora Flint, MD;  Location: [facility] LD ORS;  Service: Obstetrics;  Laterality: N/A;     Current Outpatient Medications on File Prior to Visit  Medication Sig Dispense Refill   amphetamine-dextroamphetamine (ADDERALL XR) 20 MG 24 hr capsule Take 20 mg by mouth daily. Takes 1-2 daily     calcium carbonate (TUMS - DOSED IN MG ELEMENTAL CALCIUM) 500 MG chewable tablet Chew 2 tablets by mouth 3 (three) times daily as needed for indigestion or heartburn.     FLUoxetine (PROZAC) 20 MG tablet Take 20 mg by mouth daily.     ibuprofen  (ADVIL ) 600 MG tablet Take 1 tablet (600 mg total) by mouth every 6 (six) hours as needed for moderate pain. 30 tablet 0   methadone  (DOLOPHINE ) 10  MG/ML solution Take 23 mg by mouth daily.     Prenatal MV & Min w/FA-DHA (PRENATAL ADULT GUMMY/DHA/FA PO) Take 2 tablets by mouth daily.      No current facility-administered medications on file prior to visit.      No Known Allergies   Patient advised of first appointment in our office and when to arrive.   All questions were answered.

## 2023-10-05 ENCOUNTER — Ambulatory Visit: Attending: Maternal & Fetal Medicine | Admitting: Maternal & Fetal Medicine

## 2023-10-05 ENCOUNTER — Other Ambulatory Visit: Payer: Self-pay | Admitting: *Deleted

## 2023-10-05 ENCOUNTER — Ambulatory Visit

## 2023-10-05 VITALS — BP 133/91 | HR 105

## 2023-10-05 DIAGNOSIS — F419 Anxiety disorder, unspecified: Secondary | ICD-10-CM

## 2023-10-05 DIAGNOSIS — Z79899 Other long term (current) drug therapy: Secondary | ICD-10-CM | POA: Diagnosis not present

## 2023-10-05 DIAGNOSIS — O09522 Supervision of elderly multigravida, second trimester: Secondary | ICD-10-CM

## 2023-10-05 DIAGNOSIS — Z3A19 19 weeks gestation of pregnancy: Secondary | ICD-10-CM | POA: Diagnosis not present

## 2023-10-05 DIAGNOSIS — O99342 Other mental disorders complicating pregnancy, second trimester: Secondary | ICD-10-CM | POA: Diagnosis not present

## 2023-10-05 DIAGNOSIS — F1110 Opioid abuse, uncomplicated: Secondary | ICD-10-CM

## 2023-10-05 DIAGNOSIS — O99340 Other mental disorders complicating pregnancy, unspecified trimester: Secondary | ICD-10-CM | POA: Insufficient documentation

## 2023-10-05 DIAGNOSIS — O34219 Maternal care for unspecified type scar from previous cesarean delivery: Secondary | ICD-10-CM | POA: Diagnosis not present

## 2023-10-05 DIAGNOSIS — F1120 Opioid dependence, uncomplicated: Secondary | ICD-10-CM | POA: Diagnosis not present

## 2023-10-05 DIAGNOSIS — O99322 Drug use complicating pregnancy, second trimester: Secondary | ICD-10-CM

## 2023-10-05 DIAGNOSIS — Z363 Encounter for antenatal screening for malformations: Secondary | ICD-10-CM | POA: Insufficient documentation

## 2023-10-05 NOTE — Progress Notes (Signed)
 Patient information  Patient Name: Jessica Huffman  Patient MRN:   [id number]  Referring practice: MFM Referring Provider: [facility]  MFM CONSULT  Jessica Huffman is a 33 y.o. [id number] at [redacted]w[redacted]d here for ultrasound and consultation. 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  RE opiate maintenance: The patient is doing well on methadone  at a lower dose than last pregnancy.  She knows she may have to have dose adjustment during pregnancy and there will be a need for monitoring for signs or symptoms of neonatal abstinence syndrome.  RE anxiety: The patient was previously on Wellbutrin but was encouraged to consider switching to Prozac during the pregnancy by her OB provider.  Her psychiatrist reports that Wellbutrin is acceptable during pregnancy.  I discussed that this is also appropriate to take during pregnancy if the benefit outweighs the risk.  She is beyond the point of any birth defects and Wellbutrin is not highly associated with any birth defects at baseline.  She will discuss with her psychiatrist about switching back to Wellbutrin.  She is aware that any medication or dosing changes should occur under the supervision of her psychiatrist to avoid precipitate withdrawal.  RE history of cesarean delivery: Patient desires repeat cesarean delivery.  RE elevated BP: Patient's blood pressure was just slightly higher than normal with repeat of 133/91 with repeat of 131/91.  She denies a history of chronic hypertension and will get an at home blood pressure cuff monitoring.  Sonographic findings Single  intrauterine pregnancy at 19w 6d  Fetal cardiac activity:  Observed and appears normal. Presentation: Cephalic. The anatomic structures that were well seen appear normal without evidence of soft markers. Due to poor acoustic windows some structures remain suboptimally visualized. Fetal biometry shows the estimated fetal weight at the 56 percentile.  Amniotic fluid: Within normal limits.   Placenta: Anterior. Adnexa: No abnormality visualized. Cervical length: 4.8 cm.  There are limitations of prenatal ultrasound such as the inability to detect certain abnormalities due to poor visualization. Various factors such as fetal position, gestational age and maternal body habitus may increase the difficulty in visualizing the fetal anatomy.    Recommendations - EDD should be 02/23/2024 based on  Early Ultrasound  (07/25/23). - Follow up ultrasound in 4-6 weeks to attempt visualization of the anatomy not seen and reassess the fetal growth - Baseline preeclampsia labs - Consider a 81 mg of aspirin to reduce risk of preE - Patient's blood pressure was slightly elevated today and she will get an at home blood pressure cuff to monitor for chronic hypertension.  Review of Systems: A review of systems was performed and was negative except per HPI   Vitals and Physical Exam    10/05/2023    1:56 PM 08/31/2023    1:56 PM 08/31/2023    1:14 PM  Vitals with BMI  Systolic 133 124 147  Diastolic 91 80 90  Pulse 105  829    Sitting comfortably on the sonogram table Nonlabored breathing Normal rate and rhythm Abdomen is nontender  Past pregnancies OB History  Gravida Para Term Preterm AB Living  4 2 2  0 1 2  SAB IAB Ectopic Multiple Live Births  0 1 0 0 2    # Outcome Date GA Lbr Len/2nd Weight Sex Type Anes PTL Lv  4 Current           3 Term 10/22/20 [redacted]w[redacted]d  8 lb 8 oz (3.855 kg) F CS-Vac Spinal  LIV  2 Term 03/21/17 [redacted]w[redacted]d  7 lb 8.1 oz (3.405 kg) M CS-LTranv EPI  LIV  1 IAB 2007              I spent 45 minutes reviewing the patients chart, including labs and images as well as counseling the patient about her medical conditions. Greater than 50% of the time was spent in direct face-to-face patient counseling.  Penney Bowling  MFM, [facility]   10/05/2023  2:54 PM

## 2023-11-09 ENCOUNTER — Ambulatory Visit (HOSPITAL_BASED_OUTPATIENT_CLINIC_OR_DEPARTMENT_OTHER): Admitting: Obstetrics

## 2023-11-09 ENCOUNTER — Other Ambulatory Visit: Payer: Self-pay | Admitting: *Deleted

## 2023-11-09 ENCOUNTER — Ambulatory Visit: Attending: Maternal & Fetal Medicine

## 2023-11-09 VITALS — BP 134/90 | HR 108

## 2023-11-09 DIAGNOSIS — F1110 Opioid abuse, uncomplicated: Secondary | ICD-10-CM | POA: Diagnosis not present

## 2023-11-09 DIAGNOSIS — O09522 Supervision of elderly multigravida, second trimester: Secondary | ICD-10-CM | POA: Insufficient documentation

## 2023-11-09 DIAGNOSIS — O99322 Drug use complicating pregnancy, second trimester: Secondary | ICD-10-CM

## 2023-11-09 DIAGNOSIS — O34219 Maternal care for unspecified type scar from previous cesarean delivery: Secondary | ICD-10-CM

## 2023-11-09 DIAGNOSIS — F1120 Opioid dependence, uncomplicated: Secondary | ICD-10-CM

## 2023-11-09 DIAGNOSIS — Z3A24 24 weeks gestation of pregnancy: Secondary | ICD-10-CM | POA: Insufficient documentation

## 2023-11-09 DIAGNOSIS — Z79891 Long term (current) use of opiate analgesic: Secondary | ICD-10-CM

## 2023-11-10 NOTE — Progress Notes (Signed)
 MFM Consult Note  Jessica Huffman is currently at 24 weeks and 6 days.  She has been followed due to advanced maternal age and treatment with methadone  replacement therapy.  She denies any problems since her last exam.    On today's exam, the overall EFW of 1 pound 14 ounces measures at the 77th percentile for her gestational age.  There was normal amniotic fluid noted.    Due to maternal methadone  treatment, we will continue to follow her with growth ultrasounds throughout her pregnancy.    A follow-up growth scan was scheduled in 4 weeks.  The patient stated that all of her questions were answered today.  A total of 20 minutes was spent counseling and coordinating the care for this patient.  Greater than 50% of the time was spent in direct face-to-face contact.

## 2023-12-10 ENCOUNTER — Ambulatory Visit (HOSPITAL_BASED_OUTPATIENT_CLINIC_OR_DEPARTMENT_OTHER)

## 2023-12-10 ENCOUNTER — Ambulatory Visit: Attending: Obstetrics | Admitting: Obstetrics

## 2023-12-10 ENCOUNTER — Other Ambulatory Visit: Payer: Self-pay | Admitting: *Deleted

## 2023-12-10 DIAGNOSIS — Z3A29 29 weeks gestation of pregnancy: Secondary | ICD-10-CM

## 2023-12-10 DIAGNOSIS — O99323 Drug use complicating pregnancy, third trimester: Secondary | ICD-10-CM | POA: Insufficient documentation

## 2023-12-10 DIAGNOSIS — O34219 Maternal care for unspecified type scar from previous cesarean delivery: Secondary | ICD-10-CM | POA: Insufficient documentation

## 2023-12-10 DIAGNOSIS — F1190 Opioid use, unspecified, uncomplicated: Secondary | ICD-10-CM | POA: Insufficient documentation

## 2023-12-10 DIAGNOSIS — O09523 Supervision of elderly multigravida, third trimester: Secondary | ICD-10-CM | POA: Diagnosis not present

## 2023-12-10 DIAGNOSIS — O09522 Supervision of elderly multigravida, second trimester: Secondary | ICD-10-CM

## 2023-12-10 DIAGNOSIS — Z362 Encounter for other antenatal screening follow-up: Secondary | ICD-10-CM | POA: Insufficient documentation

## 2023-12-10 DIAGNOSIS — O99343 Other mental disorders complicating pregnancy, third trimester: Secondary | ICD-10-CM

## 2023-12-10 DIAGNOSIS — O3663X0 Maternal care for excessive fetal growth, third trimester, not applicable or unspecified: Secondary | ICD-10-CM

## 2023-12-10 DIAGNOSIS — F1120 Opioid dependence, uncomplicated: Secondary | ICD-10-CM

## 2023-12-10 DIAGNOSIS — Z79891 Long term (current) use of opiate analgesic: Secondary | ICD-10-CM

## 2023-12-10 NOTE — Progress Notes (Signed)
 MFM Consult Note  Jessica Huffman is currently at 29 weeks and 2 days.  She has been followed due to advanced maternal age and maternal treatment with methadone .    She denies any problems since her last exam and has screened negative for gestational diabetes in her current pregnancy.  On today's exam, the overall EFW of 3 pounds 11 ounces measures at the 92nd percentile for her gestational age.    There was normal amniotic fluid noted with a total AFI of 16.87 cm.    Due to maternal methadone  treatment, a follow-up growth scan was scheduled in 6 weeks.    The patient stated that all of her questions were answered today.  A total of 10 minutes was spent counseling and coordinating the care for this patient.  Greater than 50% of the time was spent in direct face-to-face contact.

## 2024-01-21 ENCOUNTER — Ambulatory Visit: Payer: MEDICAID

## 2024-01-21 ENCOUNTER — Ambulatory Visit: Payer: MEDICAID | Attending: Obstetrics | Admitting: Maternal & Fetal Medicine

## 2024-01-21 DIAGNOSIS — F1120 Opioid dependence, uncomplicated: Secondary | ICD-10-CM

## 2024-01-21 DIAGNOSIS — Z98891 History of uterine scar from previous surgery: Secondary | ICD-10-CM | POA: Diagnosis not present

## 2024-01-21 DIAGNOSIS — O34219 Maternal care for unspecified type scar from previous cesarean delivery: Secondary | ICD-10-CM | POA: Diagnosis not present

## 2024-01-21 DIAGNOSIS — O99323 Drug use complicating pregnancy, third trimester: Secondary | ICD-10-CM

## 2024-01-21 DIAGNOSIS — Z3A35 35 weeks gestation of pregnancy: Secondary | ICD-10-CM | POA: Insufficient documentation

## 2024-01-21 DIAGNOSIS — O09523 Supervision of elderly multigravida, third trimester: Secondary | ICD-10-CM

## 2024-01-21 DIAGNOSIS — F1920 Other psychoactive substance dependence, uncomplicated: Secondary | ICD-10-CM

## 2024-01-21 DIAGNOSIS — Z362 Encounter for other antenatal screening follow-up: Secondary | ICD-10-CM | POA: Diagnosis not present

## 2024-01-21 DIAGNOSIS — O3663X0 Maternal care for excessive fetal growth, third trimester, not applicable or unspecified: Secondary | ICD-10-CM

## 2024-01-21 NOTE — Progress Notes (Signed)
   Patient information  Patient Name: Jessica Huffman  Patient MRN:   [id number]  Referring practice: [facility] Referring Provider: [facility]  Problem List   Patient Active Problem List   Diagnosis Date Noted   Anxiety during pregnancy 10/05/2023   Chronic use of opiate for therapeutic purpose 08/31/2023   Pregnancy with history of cesarean section, antepartum(x2) 08/23/2023   AMA (advanced maternal age) multigravida 35+ 08/23/2023   History of cesarean section 10/22/2020   History of heroin abuse (HCC) 12/22/2016   Female genital lesion 12/22/2016   Maternal Fetal medicine Consult Jessica Huffman is a 33 y.o. [id number] at [redacted]w[redacted]d here for ultrasound and consultation. Jessica Huffman is doing well today with no acute concerns. Today we focused on the following:   The estimated fetal weight was at the 99th percentile today.  I discussed that she should have weekly antenatal testing due to an increased risk of adverse outcomes in this patient population.  She reports good fetal movement.  Her cesarean delivery is scheduled for the 39-week of pregnancy.  The patient had time to ask questions that were answered to her satisfaction.  She verbalized understanding and agrees to proceed with the plan below.  Sonographic findings Single intrauterine pregnancy at 35w 2d.  Fetal cardiac activity:  Observed and appears normal. Presentation: Cephalic. Interval fetal anatomy appears normal. Fetal biometry shows the estimated fetal weight at the 99 percentile. Amniotic fluid volume: Within normal limits. MVP: 6.22 cm. Placenta: Anterior.  There are limitations of prenatal ultrasound such as the inability to detect certain abnormalities due to poor visualization. Various factors such as fetal position, gestational age and maternal body habitus may increase the difficulty in visualizing the fetal anatomy.    Recommendations - Weekly antenatal testing should be done at the referring OB provider's  office - Delivery by repeat C-section at 39 weeks  Review of Systems: A review of systems was performed and was negative except per HPI   Vitals and Physical Exam    01/21/2024   11:26 AM 12/10/2023    8:45 AM 11/09/2023    2:39 PM  Vitals with BMI  Systolic 135 141 872  Diastolic 88 92 82  Pulse 119 123     Sitting comfortably on the sonogram table Nonlabored breathing Normal rate and rhythm Abdomen is nontender  Past pregnancies OB History  Gravida Para Term Preterm AB Living  4 2 2  0 1 2  SAB IAB Ectopic Multiple Live Births  0 1 0 0 2    # Outcome Date GA Lbr Len/2nd Weight Sex Type Anes PTL Lv  4 Current           3 Term 10/22/20 [redacted]w[redacted]d  8 lb 8 oz (3.855 kg) F CS-Vac Spinal  LIV  2 Term 03/21/17 [redacted]w[redacted]d  7 lb 8.1 oz (3.405 kg) M CS-LTranv EPI  LIV  1 IAB 2007             I spent 20 minutes reviewing the patients chart, including labs and images as well as counseling the patient about her medical conditions. Greater than 50% of the time was spent in direct face-to-face patient counseling.  Delora Smaller  MFM, [facility]   01/21/2024  12:28 PM

## 2024-01-31 ENCOUNTER — Encounter (HOSPITAL_COMMUNITY): Payer: Self-pay | Admitting: Obstetrics and Gynecology

## 2024-01-31 ENCOUNTER — Inpatient Hospital Stay (HOSPITAL_COMMUNITY)
Admission: AD | Admit: 2024-01-31 | Discharge: 2024-01-31 | Disposition: A | Discharge status: Home / Self Care | Payer: MEDICAID | Attending: Obstetrics and Gynecology | Admitting: Obstetrics and Gynecology

## 2024-01-31 DIAGNOSIS — O26893 Other specified pregnancy related conditions, third trimester: Secondary | ICD-10-CM | POA: Insufficient documentation

## 2024-01-31 DIAGNOSIS — R030 Elevated blood-pressure reading, without diagnosis of hypertension: Secondary | ICD-10-CM | POA: Diagnosis present

## 2024-01-31 DIAGNOSIS — O133 Gestational [pregnancy-induced] hypertension without significant proteinuria, third trimester: Secondary | ICD-10-CM | POA: Diagnosis not present

## 2024-01-31 DIAGNOSIS — Z3A36 36 weeks gestation of pregnancy: Secondary | ICD-10-CM | POA: Diagnosis not present

## 2024-01-31 LAB — COMPREHENSIVE METABOLIC PANEL WITH GFR
ALT: 17 U/L (ref 0–44)
AST: 25 U/L (ref 15–41)
Albumin: 2.6 g/dL — ABNORMAL LOW (ref 3.5–5.0)
Alkaline Phosphatase: 97 U/L (ref 38–126)
Anion gap: 10 (ref 5–15)
BUN: 8 mg/dL (ref 6–20)
CO2: 22 mmol/L (ref 22–32)
Calcium: 8.5 mg/dL — ABNORMAL LOW (ref 8.9–10.3)
Chloride: 103 mmol/L (ref 98–111)
Creatinine, Ser: 0.71 mg/dL (ref 0.44–1.00)
GFR, Estimated: >60 mL/min (ref >60)
Glucose, Bld: 83 mg/dL (ref 70–99)
Potassium: 4 mmol/L (ref 3.5–5.1)
Sodium: 135 mmol/L (ref 135–145)
Total Bilirubin: 0.8 mg/dL (ref 0.0–1.2)
Total Protein: 5.9 g/dL — ABNORMAL LOW (ref 6.5–8.1)

## 2024-01-31 LAB — CBC
HCT: 36.1 % (ref 36.0–46.0)
Hemoglobin: 12 g/dL (ref 12.0–15.0)
MCH: 30.2 pg (ref 26.0–34.0)
MCHC: 33.2 g/dL (ref 30.0–36.0)
MCV: 90.7 fL (ref 80.0–100.0)
Platelets: 235 K/uL (ref 150–400)
RBC: 3.98 MIL/uL (ref 3.87–5.11)
RDW: 13.4 % (ref 11.5–15.5)
WBC: 8 K/uL (ref 4.0–10.5)
nRBC: 0 % (ref 0.0–0.2)

## 2024-01-31 LAB — PROTEIN / CREATININE RATIO, URINE
Creatinine, Urine: 61 mg/dL
Protein Creatinine Ratio: 0.18 mg/mg{creat} — ABNORMAL HIGH (ref 0.00–0.15)
Total Protein, Urine: 11 mg/dL

## 2024-01-31 NOTE — Discharge Instructions (Signed)
Please check your blood pressure at least once daily. If your blood pressure is >140 (top number) or >90 (bottom number) consistently, please call your primary OB group If your blood pressure is >160 (top number) or >110 (bottom number), please return to the maternity assessment unit

## 2024-01-31 NOTE — MAU Note (Signed)
 Jessica Huffman is a 33 y.o. at [redacted]w[redacted]d here in MAU reporting: went to weekly NST, he looked ok, but her BP was elevated.  Has happened before, usually the recheck is normal. Now has a HA (since crying), no visual changes or epigastric pain.  Ankles are swollen,but this is not a change.  Denies bleeding or leaking.  GBS obtained  today, cx was closed.  Reports +FM Onset of complaint: this afternoon Pain score: HA mild Vitals:   01/31/24 1810  BP: (!) 139/92  Pulse: (!) 101  Resp: 17  Temp: 98.4 F (36.9 C)  SpO2: 98%     FHT:134 Lab orders placed from triage:  urine PCR collected

## 2024-01-31 NOTE — MAU Note (Signed)
 RN called lab to inquire about status of P/C ratio -  lab reports that they have received it and will begin processing shortly.

## 2024-01-31 NOTE — MAU Provider Note (Addendum)
 History     CSN: [id number]  Arrival date and time: 01/31/24 1754   None     Chief Complaint  Patient presents with   Hypertension   HPI Patient is a 33 year old G4 P2-0-1-2 at 36 weeks 5 days presenting for elevated blood pressure.  Reports that she went to her clinic for her routine OB appointment and her blood pressure was notably elevated.  They repeated after 15 minutes and it was still elevated with diastolics being above 90.  They repeated a third time and the diastolics remained above 90.  They recommended she come to the MAU for evaluation for preeclampsia.  She has history of 2 prior C-sections.  Denies any  changes in vision, right upper quadrant pain.  Reports that she had a headache when she left the office but it has resolved.  Reports considerable lower extremity swelling throughout this pregnancy.  No other issues or concerns. OB History     Gravida  4   Para  2   Term  2   Preterm  0   AB  1   Living  2      SAB  0   IAB  1   Ectopic  0   Multiple  0   Live Births  2           Past Medical History:  Diagnosis Date   Depression    History of heroin abuse (HCC)    Methadone  maintenance treatment affecting pregnancy in second trimester (HCC) 10/02/2023    Past Surgical History:  Procedure Laterality Date   CESAREAN SECTION N/A 03/21/2017   Procedure: CESAREAN SECTION;  Surgeon: Jayne Vonn DEL, MD;  Location: [facility] BIRTHING SUITES;  Service: Obstetrics;  Laterality: N/A;   CESAREAN SECTION N/A 10/22/2020   Procedure: REPEAT CESAREAN SECTION EDC: 10-22-20  ALLERG: NKDA;  Surgeon: Curlene Agent, MD;  Location: [facility] LD ORS;  Service: Obstetrics;  Laterality: N/A;    Family History  Problem Relation Age of Onset   Hypertension Mother    Hypertension Father    Cancer Father        throat   Asthma Neg Hx    Diabetes Neg Hx    Heart disease Neg Hx     Social History   Tobacco Use   Smoking status: Former    Types: Cigarettes   Smokeless  tobacco: Never  Vaping Use   Vaping status: Former   Quit date: 10/15/2014   Substances: Flavoring  Substance Use Topics   Alcohol use: No   Drug use: Yes    Types: Heroin    Comment: Heroin last used 07/2016, on methadone     Allergies: No Known Allergies  Medications Prior to Admission  Medication Sig Dispense Refill Last Dose/Taking   amphetamine-dextroamphetamine (ADDERALL XR) 20 MG 24 hr capsule Take 20 mg by mouth daily. Takes 1-2 daily   01/31/2024   calcium carbonate (TUMS - DOSED IN MG ELEMENTAL CALCIUM) 500 MG chewable tablet Chew 2 tablets by mouth 3 (three) times daily as needed for indigestion or heartburn.   01/31/2024   FLUoxetine (PROZAC) 20 MG tablet Take 20 mg by mouth daily.   01/31/2024   methadone  (DOLOPHINE ) 10 MG/ML solution Take 23 mg by mouth daily.   01/31/2024   Prenatal MV & Min w/FA-DHA (PRENATAL ADULT GUMMY/DHA/FA PO) Take 2 tablets by mouth daily.    01/30/2024    Review of Systems  Eyes:  Negative for visual disturbance.  Cardiovascular:  Positive  for leg swelling.  Gastrointestinal:  Negative for abdominal pain, nausea and vomiting.  Genitourinary:  Negative for vaginal bleeding and vaginal discharge.  Neurological:  Positive for headaches.   Physical Exam   Blood pressure (!) 127/90, pulse 100, temperature 98.5 F (36.9 C), temperature source Oral, resp. rate 18, height 5' 3 (1.6 m), weight 116.2 kg, last menstrual period 05/27/2023, SpO2 97%, unknown if currently breastfeeding.  Physical Exam Vitals and nursing note reviewed.  Constitutional:      Appearance: Normal appearance.  HENT:     Head: Normocephalic and atraumatic.     Nose: No congestion or rhinorrhea.  Eyes:     Extraocular Movements: Extraocular movements intact.  Cardiovascular:     Rate and Rhythm: Normal rate.  Pulmonary:     Effort: Pulmonary effort is normal.  Abdominal:     Palpations: Abdomen is soft.     Tenderness: There is no abdominal tenderness.  Musculoskeletal:         General: Normal range of motion.     Cervical back: Normal range of motion.  Skin:    General: Skin is warm.     Capillary Refill: Capillary refill takes less than 2 seconds.  Neurological:     General: No focal deficit present.     Mental Status: She is alert.     Cranial Nerves: No cranial nerve deficit.  Psychiatric:        Mood and Affect: Mood normal.        Behavior: Behavior normal.     MAU Course  Procedures  MDM CBC CMP PC ratio Physical exam NST   Assessment and Plan  Jessica Huffman is a 33 year old G4, P2 at 36 weeks 5 days presenting for preeclampsia evaluation.  Gestational hypertension versus preeclampsia Patient with elevated blood pressures in the office today.  On arrival blood pressure elevated at 139/92.  Repeat 15 minutes later was 132/95.  CBC was within normal limits.  CMP also within normal limits.  PC ratio was0.18.  Patient with likely gestational hypertension but was not 4 hours between elevated blood pressures.  Patient will be scheduled for blood pressure check tomorrow in her clinic and discussed that if it is elevated she will likely need delivery at 37 weeks.  Patient understanding of this.  Patient had 2-minute deceleration just prior to being discharged to on-call provider called.  Requested continued monitoring for another 30 minutes and if no further decelerations she could be discharged home and would follow-up in the clinic tomorrow.  Handed off to oncoming provider who will follow up and if category 1 strip for the next 30 minutes patient will be discharged home.  Jessica Huffman 01/31/2024, 7:02 PM    Reassessment at 2047 EFM remains cat I tracing. Discharge orders placed. Return precaution given. Has OB appt for tomorrow.  Alain Sor, MD OB Fellow, Faculty Practice [facility], [facility]

## 2024-02-01 ENCOUNTER — Encounter (HOSPITAL_COMMUNITY): Payer: Self-pay

## 2024-02-01 ENCOUNTER — Encounter (HOSPITAL_COMMUNITY): Payer: Self-pay | Admitting: Obstetrics and Gynecology

## 2024-02-01 NOTE — Patient Instructions (Signed)
 Jessica Huffman  02/01/2024   Your procedure is scheduled on:  02/04/2024  Arrive at 0530 at Entrance C on [address] at [facility]  and [employer]. You are invited to use the FREE valet parking or use the Visitor's parking deck.  Pick up the phone at the desk and dial [phone].  Call this number if you have problems the morning of surgery: [phone]  Remember:   Do not eat food:(After Midnight) Desps de medianoche.  You may drink clear liquids until  __0330___.  Clear liquids means a liquid you can see thru.  It can have color such as Cola or Kool aid.  Tea is OK and coffee as long as no milk or creamer of any kind.  Take these medicines the morning of surgery with A SIP OF WATER :  Take prozac and methadone  as prescribed   Do not wear jewelry, make-up or nail polish.  Do not wear lotions, powders, or perfumes. Do not wear deodorant.  Do not shave 48 hours prior to surgery.  Do not bring valuables to the hospital.  [facility] is not   responsible for any belongings or valuables brought to the hospital.  Contacts, dentures or bridgework may not be worn into surgery.  Leave suitcase in the car. After surgery it may be brought to your room.  For patients admitted to the hospital, checkout time is 11:00 AM the day of              discharge.      Please read over the following fact sheets that you were given:     Preparing for Surgery

## 2024-02-04 ENCOUNTER — Encounter (HOSPITAL_COMMUNITY)
Admission: AD | Disposition: A | Discharge status: Home / Self Care | Payer: Self-pay | Source: Home / Self Care | Attending: Obstetrics and Gynecology

## 2024-02-04 ENCOUNTER — Inpatient Hospital Stay (HOSPITAL_COMMUNITY)
Admission: AD | Admit: 2024-02-04 | Discharge: 2024-02-07 | DRG: 787 | Disposition: A | Discharge status: Home / Self Care | Payer: MEDICAID | Attending: Obstetrics and Gynecology | Admitting: Obstetrics and Gynecology

## 2024-02-04 ENCOUNTER — Other Ambulatory Visit: Payer: Self-pay

## 2024-02-04 ENCOUNTER — Encounter (HOSPITAL_COMMUNITY): Payer: Self-pay

## 2024-02-04 ENCOUNTER — Inpatient Hospital Stay (HOSPITAL_COMMUNITY): Payer: MEDICAID | Admitting: Anesthesiology

## 2024-02-04 ENCOUNTER — Encounter (HOSPITAL_COMMUNITY): Payer: Self-pay | Admitting: Obstetrics and Gynecology

## 2024-02-04 DIAGNOSIS — O99324 Drug use complicating childbirth: Secondary | ICD-10-CM | POA: Diagnosis present

## 2024-02-04 DIAGNOSIS — O34211 Maternal care for low transverse scar from previous cesarean delivery: Principal | ICD-10-CM | POA: Diagnosis present

## 2024-02-04 DIAGNOSIS — E66813 Obesity, class 3: Secondary | ICD-10-CM | POA: Diagnosis present

## 2024-02-04 DIAGNOSIS — O134 Gestational [pregnancy-induced] hypertension without significant proteinuria, complicating childbirth: Secondary | ICD-10-CM | POA: Diagnosis present

## 2024-02-04 DIAGNOSIS — Z302 Encounter for sterilization: Secondary | ICD-10-CM | POA: Diagnosis not present

## 2024-02-04 DIAGNOSIS — Z87891 Personal history of nicotine dependence: Secondary | ICD-10-CM | POA: Diagnosis not present

## 2024-02-04 DIAGNOSIS — Z3A37 37 weeks gestation of pregnancy: Secondary | ICD-10-CM | POA: Diagnosis not present

## 2024-02-04 DIAGNOSIS — O99214 Obesity complicating childbirth: Secondary | ICD-10-CM | POA: Diagnosis present

## 2024-02-04 DIAGNOSIS — Z98891 History of uterine scar from previous surgery: Principal | ICD-10-CM

## 2024-02-04 DIAGNOSIS — Z8249 Family history of ischemic heart disease and other diseases of the circulatory system: Secondary | ICD-10-CM

## 2024-02-04 DIAGNOSIS — F1120 Opioid dependence, uncomplicated: Secondary | ICD-10-CM | POA: Diagnosis present

## 2024-02-04 DIAGNOSIS — O3663X0 Maternal care for excessive fetal growth, third trimester, not applicable or unspecified: Secondary | ICD-10-CM | POA: Diagnosis present

## 2024-02-04 LAB — CBC
HCT: 35 % — ABNORMAL LOW (ref 36.0–46.0)
Hemoglobin: 11.7 g/dL — ABNORMAL LOW (ref 12.0–15.0)
MCH: 30.2 pg (ref 26.0–34.0)
MCHC: 33.4 g/dL (ref 30.0–36.0)
MCV: 90.2 fL (ref 80.0–100.0)
Platelets: 192 K/uL (ref 150–400)
RBC: 3.88 MIL/uL (ref 3.87–5.11)
RDW: 13.4 % (ref 11.5–15.5)
WBC: 6.7 K/uL (ref 4.0–10.5)
nRBC: 0 % (ref 0.0–0.2)

## 2024-02-04 LAB — TYPE AND SCREEN
ABO/RH(D): A POS
Antibody Screen: NEGATIVE

## 2024-02-04 SURGERY — Surgical Case
Anesthesia: Spinal

## 2024-02-04 MED ORDER — SODIUM CHLORIDE 0.9 % IV SOLN: 12.5000 mg | INTRAVENOUS | Status: DC | PRN

## 2024-02-04 MED ORDER — ACETAMINOPHEN 10 MG/ML IV SOLN
INTRAVENOUS | Status: AC
  Filled 2024-02-04: qty 100

## 2024-02-04 MED ORDER — SODIUM CHLORIDE 0.9 % IV SOLN
INTRAVENOUS | Status: AC
  Filled 2024-02-04: qty 2

## 2024-02-04 MED ORDER — BUPIVACAINE IN DEXTROSE 0.75-8.25 % IT SOLN
INTRATHECAL | Status: DC | PRN
  Administered 2024-02-04: 1.6 mL via INTRATHECAL

## 2024-02-04 MED ORDER — SODIUM CHLORIDE 0.9 % IV SOLN: INTRAVENOUS | Status: DC | PRN

## 2024-02-04 MED ORDER — KETOROLAC TROMETHAMINE 30 MG/ML IJ SOLN: 30.0000 mg | Freq: Four times a day (QID) | INTRAMUSCULAR | Status: AC | PRN

## 2024-02-04 MED ORDER — FAMOTIDINE 20 MG PO TABS: 20.0000 mg | ORAL_TABLET | Freq: Once | ORAL | Status: DC

## 2024-02-04 MED ORDER — NALOXONE HCL 0.4 MG/ML IJ SOLN: 0.4000 mg | INTRAMUSCULAR | Status: DC | PRN

## 2024-02-04 MED ORDER — SODIUM CHLORIDE 0.9 % IV SOLN
INTRAVENOUS | Status: DC | PRN
  Administered 2024-02-04: 2 g via INTRAVENOUS

## 2024-02-04 MED ORDER — SCOPOLAMINE 1 MG/3DAYS TD PT72: 1.0000 | MEDICATED_PATCH | Freq: Once | TRANSDERMAL | Status: DC

## 2024-02-04 MED ORDER — CEFAZOLIN SODIUM-DEXTROSE 3-4 GM/150ML-% IV SOLN
INTRAVENOUS | Status: AC
  Filled 2024-02-04: qty 150

## 2024-02-04 MED ORDER — SIMETHICONE 80 MG PO CHEW
80.0000 mg | CHEWABLE_TABLET | Freq: Three times a day (TID) | ORAL | Status: DC
  Administered 2024-02-04 – 2024-02-06 (×6): 80 mg via ORAL
  Filled 2024-02-04 (×6): qty 1

## 2024-02-04 MED ORDER — LACTATED RINGERS IV SOLN: INTRAVENOUS | Status: DC

## 2024-02-04 MED ORDER — TETANUS-DIPHTH-ACELL PERTUSSIS 5-2.5-18.5 LF-MCG/0.5 IM SUSY: 0.5000 mL | PREFILLED_SYRINGE | Freq: Once | INTRAMUSCULAR | Status: DC

## 2024-02-04 MED ORDER — SCOPOLAMINE 1 MG/3DAYS TD PT72
1.0000 | MEDICATED_PATCH | Freq: Once | TRANSDERMAL | Status: AC
  Administered 2024-02-04: 1 mg via TRANSDERMAL

## 2024-02-04 MED ORDER — TRANEXAMIC ACID-NACL 1000-0.7 MG/100ML-% IV SOLN
INTRAVENOUS | Status: AC
  Filled 2024-02-04: qty 100

## 2024-02-04 MED ORDER — PHENYLEPHRINE 80 MCG/ML (10ML) SYRINGE FOR IV PUSH (FOR BLOOD PRESSURE SUPPORT)
PREFILLED_SYRINGE | INTRAVENOUS | Status: AC
  Filled 2024-02-04: qty 10

## 2024-02-04 MED ORDER — SCOPOLAMINE 1 MG/3DAYS TD PT72
MEDICATED_PATCH | TRANSDERMAL | Status: AC
  Filled 2024-02-04: qty 1

## 2024-02-04 MED ORDER — ACETAMINOPHEN 10 MG/ML IV SOLN
INTRAVENOUS | Status: DC | PRN
  Administered 2024-02-04: 1000 mg via INTRAVENOUS

## 2024-02-04 MED ORDER — OXYTOCIN-SODIUM CHLORIDE 30-0.9 UT/500ML-% IV SOLN
2.5000 [IU]/h | INTRAVENOUS | Status: AC
  Administered 2024-02-04: 2.5 [IU]/h via INTRAVENOUS

## 2024-02-04 MED ORDER — PHENYLEPHRINE HCL (PRESSORS) 10 MG/ML IV SOLN
INTRAVENOUS | Status: DC | PRN
  Administered 2024-02-04: 160 ug via INTRAVENOUS

## 2024-02-04 MED ORDER — CHLORHEXIDINE GLUCONATE 0.12 % MT SOLN: 15.0000 mL | Freq: Once | OROMUCOSAL | Status: DC

## 2024-02-04 MED ORDER — MEASLES, MUMPS & RUBELLA VAC IJ SOLR: 0.5000 mL | Freq: Once | INTRAMUSCULAR | Status: DC

## 2024-02-04 MED ORDER — OXYTOCIN-SODIUM CHLORIDE 30-0.9 UT/500ML-% IV SOLN
INTRAVENOUS | Status: AC
Start: 2024-02-04 — End: 2024-02-04
  Filled 2024-02-04: qty 500

## 2024-02-04 MED ORDER — ACETAMINOPHEN 325 MG PO TABS
650.0000 mg | ORAL_TABLET | ORAL | Status: DC | PRN
  Administered 2024-02-06 – 2024-02-07 (×5): 650 mg via ORAL
  Filled 2024-02-04 (×5): qty 2

## 2024-02-04 MED ORDER — ORAL CARE MOUTH RINSE: 15.0000 mL | Freq: Once | OROMUCOSAL | Status: DC

## 2024-02-04 MED ORDER — MENTHOL 3 MG MT LOZG: 1.0000 | LOZENGE | OROMUCOSAL | Status: DC | PRN

## 2024-02-04 MED ORDER — SIMETHICONE 80 MG PO CHEW: 80.0000 mg | CHEWABLE_TABLET | ORAL | Status: DC | PRN

## 2024-02-04 MED ORDER — OXYTOCIN-SODIUM CHLORIDE 30-0.9 UT/500ML-% IV SOLN
INTRAVENOUS | Status: AC
  Filled 2024-02-04: qty 500

## 2024-02-04 MED ORDER — KETOROLAC TROMETHAMINE 30 MG/ML IJ SOLN
INTRAMUSCULAR | Status: AC
  Filled 2024-02-04: qty 1

## 2024-02-04 MED ORDER — METHADONE HCL 10 MG PO TABS: 26.0000 mg | ORAL_TABLET | Freq: Every day | ORAL | Status: DC

## 2024-02-04 MED ORDER — KETOROLAC TROMETHAMINE 30 MG/ML IJ SOLN
30.0000 mg | Freq: Four times a day (QID) | INTRAMUSCULAR | Status: AC | PRN
  Administered 2024-02-04: 30 mg via INTRAVENOUS

## 2024-02-04 MED ORDER — WITCH HAZEL-GLYCERIN EX PADS: 1.0000 | MEDICATED_PAD | CUTANEOUS | Status: DC | PRN

## 2024-02-04 MED ORDER — METHADONE HCL 10 MG/ML PO CONC: 23.0000 mg | Freq: Every day | ORAL | Status: DC

## 2024-02-04 MED ORDER — KETOROLAC TROMETHAMINE 30 MG/ML IJ SOLN
30.0000 mg | Freq: Four times a day (QID) | INTRAMUSCULAR | Status: AC
  Administered 2024-02-04 – 2024-02-05 (×4): 30 mg via INTRAVENOUS
  Filled 2024-02-04 (×4): qty 1

## 2024-02-04 MED ORDER — DIPHENHYDRAMINE HCL 25 MG PO CAPS: 25.0000 mg | ORAL_CAPSULE | ORAL | Status: DC | PRN

## 2024-02-04 MED ORDER — FENTANYL CITRATE (PF) 100 MCG/2ML IJ SOLN
INTRAMUSCULAR | Status: DC | PRN
  Administered 2024-02-04: 15 ug via INTRAVENOUS

## 2024-02-04 MED ORDER — IBUPROFEN 600 MG PO TABS
600.0000 mg | ORAL_TABLET | Freq: Four times a day (QID) | ORAL | Status: DC | PRN
  Administered 2024-02-05 – 2024-02-07 (×8): 600 mg via ORAL
  Filled 2024-02-04 (×8): qty 1

## 2024-02-04 MED ORDER — AMISULPRIDE (ANTIEMETIC) 5 MG/2ML IV SOLN: 10.0000 mg | Freq: Once | INTRAVENOUS | Status: DC | PRN

## 2024-02-04 MED ORDER — ONDANSETRON HCL 4 MG/2ML IJ SOLN
INTRAMUSCULAR | Status: AC
  Filled 2024-02-04: qty 2

## 2024-02-04 MED ORDER — FLUOXETINE HCL 20 MG PO CAPS
20.0000 mg | ORAL_CAPSULE | Freq: Every day | ORAL | Status: DC
  Administered 2024-02-05 – 2024-02-07 (×3): 20 mg via ORAL
  Filled 2024-02-04 (×3): qty 1

## 2024-02-04 MED ORDER — OXYCODONE HCL 5 MG/5ML PO SOLN: 5.0000 mg | Freq: Once | ORAL | Status: DC | PRN

## 2024-02-04 MED ORDER — SENNOSIDES-DOCUSATE SODIUM 8.6-50 MG PO TABS
2.0000 | ORAL_TABLET | ORAL | Status: DC
  Administered 2024-02-04 – 2024-02-05 (×2): 2 via ORAL
  Filled 2024-02-04 (×3): qty 2

## 2024-02-04 MED ORDER — DEXAMETHASONE SODIUM PHOSPHATE 10 MG/ML IJ SOLN
INTRAMUSCULAR | Status: AC
  Filled 2024-02-04: qty 1

## 2024-02-04 MED ORDER — SOD CITRATE-CITRIC ACID 500-334 MG/5ML PO SOLN
ORAL | Status: AC
  Filled 2024-02-04: qty 30

## 2024-02-04 MED ORDER — DIPHENHYDRAMINE HCL 25 MG PO CAPS: 25.0000 mg | ORAL_CAPSULE | Freq: Four times a day (QID) | ORAL | Status: DC | PRN

## 2024-02-04 MED ORDER — STERILE WATER FOR IRRIGATION IR SOLN
Status: DC | PRN
  Administered 2024-02-04: 1000 mL

## 2024-02-04 MED ORDER — FENTANYL CITRATE (PF) 100 MCG/2ML IJ SOLN
INTRAMUSCULAR | Status: AC
  Filled 2024-02-04: qty 2

## 2024-02-04 MED ORDER — OXYCODONE-ACETAMINOPHEN 5-325 MG PO TABS: 1.0000 | ORAL_TABLET | ORAL | Status: DC | PRN

## 2024-02-04 MED ORDER — DIBUCAINE (PERIANAL) 1 % EX OINT: 1.0000 | TOPICAL_OINTMENT | CUTANEOUS | Status: DC | PRN

## 2024-02-04 MED ORDER — NALOXONE HCL 4 MG/10ML IJ SOLN: 1.0000 ug/kg/h | INTRAVENOUS | Status: DC | PRN

## 2024-02-04 MED ORDER — TRANEXAMIC ACID-NACL 1000-0.7 MG/100ML-% IV SOLN
INTRAVENOUS | Status: DC | PRN
  Administered 2024-02-04: 1000 mg via INTRAVENOUS

## 2024-02-04 MED ORDER — METHADONE HCL 10 MG/ML PO CONC
26.0000 mg | Freq: Every day | ORAL | Status: DC
  Administered 2024-02-04 – 2024-02-06 (×3): 26 mg via ORAL
  Filled 2024-02-04 (×3): qty 5

## 2024-02-04 MED ORDER — CEFAZOLIN SODIUM-DEXTROSE 3-4 GM/150ML-% IV SOLN: 3.0000 g | INTRAVENOUS | Status: DC

## 2024-02-04 MED ORDER — ONDANSETRON HCL 4 MG/2ML IJ SOLN
INTRAMUSCULAR | Status: DC | PRN
  Administered 2024-02-04: 4 mg via INTRAVENOUS

## 2024-02-04 MED ORDER — ACETAMINOPHEN 500 MG PO TABS: 1000.0000 mg | ORAL_TABLET | Freq: Once | ORAL | Status: DC

## 2024-02-04 MED ORDER — MORPHINE SULFATE (PF) 0.5 MG/ML IJ SOLN
INTRAMUSCULAR | Status: AC
  Filled 2024-02-04: qty 10

## 2024-02-04 MED ORDER — SODIUM CHLORIDE 0.9 % IV SOLN
500.0000 mg | INTRAVENOUS | Status: DC
  Filled 2024-02-04: qty 5

## 2024-02-04 MED ORDER — OXYCODONE HCL 5 MG PO TABS: 5.0000 mg | ORAL_TABLET | Freq: Once | ORAL | Status: DC | PRN

## 2024-02-04 MED ORDER — PRENATAL MULTIVITAMIN CH
1.0000 | ORAL_TABLET | Freq: Every day | ORAL | Status: DC
  Administered 2024-02-04 – 2024-02-07 (×3): 1 via ORAL
  Filled 2024-02-04 (×4): qty 1

## 2024-02-04 MED ORDER — SODIUM CHLORIDE 0.9 % IR SOLN
Status: DC | PRN
  Administered 2024-02-04: 1

## 2024-02-04 MED ORDER — COCONUT OIL OIL
1.0000 | TOPICAL_OIL | Status: DC | PRN
Start: 2024-02-04 — End: 2024-02-07
  Administered 2024-02-05 (×2): 1 via TOPICAL

## 2024-02-04 MED ORDER — DIPHENHYDRAMINE HCL 50 MG/ML IJ SOLN: 12.5000 mg | INTRAMUSCULAR | Status: DC | PRN

## 2024-02-04 MED ORDER — ACETAMINOPHEN 160 MG/5ML PO SOLN: 960.0000 mg | Freq: Once | ORAL | Status: DC

## 2024-02-04 MED ORDER — PHENYLEPHRINE HCL-NACL 20-0.9 MG/250ML-% IV SOLN
INTRAVENOUS | Status: DC | PRN
  Administered 2024-02-04: 60 ug via INTRAVENOUS
  Administered 2024-02-04: 60 ug/min via INTRAVENOUS

## 2024-02-04 MED ORDER — MORPHINE SULFATE (PF) 0.5 MG/ML IJ SOLN
INTRAMUSCULAR | Status: DC | PRN
  Administered 2024-02-04: 150 ug via EPIDURAL

## 2024-02-04 MED ORDER — SODIUM CHLORIDE 0.9% FLUSH
3.0000 mL | INTRAVENOUS | Status: DC | PRN
Start: 2024-02-04 — End: 2024-02-07

## 2024-02-04 MED ORDER — DEXAMETHASONE SODIUM PHOSPHATE 10 MG/ML IJ SOLN
INTRAMUSCULAR | Status: DC | PRN
  Administered 2024-02-04: 10 mg via INTRAVENOUS

## 2024-02-04 MED ORDER — OXYTOCIN-SODIUM CHLORIDE 30-0.9 UT/500ML-% IV SOLN
INTRAVENOUS | Status: DC | PRN
  Administered 2024-02-04: 30 [IU] via INTRAVENOUS

## 2024-02-04 MED ORDER — SOD CITRATE-CITRIC ACID 500-334 MG/5ML PO SOLN
30.0000 mL | ORAL | Status: AC
  Administered 2024-02-04: 30 mL via ORAL

## 2024-02-04 MED ORDER — MEPERIDINE HCL 25 MG/ML IJ SOLN: 6.2500 mg | INTRAMUSCULAR | Status: DC | PRN

## 2024-02-04 MED ORDER — FENTANYL CITRATE (PF) 100 MCG/2ML IJ SOLN: 25.0000 ug | INTRAMUSCULAR | Status: DC | PRN

## 2024-02-04 MED ORDER — OXYCODONE HCL 5 MG PO TABS
5.0000 mg | ORAL_TABLET | ORAL | Status: DC | PRN
  Administered 2024-02-05: 10 mg via ORAL
  Administered 2024-02-05: 5 mg via ORAL
  Administered 2024-02-05 (×2): 10 mg via ORAL
  Administered 2024-02-05 (×2): 5 mg via ORAL
  Administered 2024-02-06 – 2024-02-07 (×3): 10 mg via ORAL
  Filled 2024-02-04 (×3): qty 2
  Filled 2024-02-04 (×2): qty 1
  Filled 2024-02-04 (×4): qty 2
  Filled 2024-02-04: qty 1

## 2024-02-04 MED ORDER — ONDANSETRON HCL 4 MG/2ML IJ SOLN: 4.0000 mg | Freq: Three times a day (TID) | INTRAMUSCULAR | Status: DC | PRN

## 2024-02-04 MED ORDER — LACTATED RINGERS IV SOLN: INTRAVENOUS | Status: DC | PRN

## 2024-02-04 MED ORDER — ZOLPIDEM TARTRATE 5 MG PO TABS: 5.0000 mg | ORAL_TABLET | Freq: Every evening | ORAL | Status: DC | PRN

## 2024-02-04 SURGICAL SUPPLY — 25 items
BENZOIN TINCTURE PRP APPL 2/3 (GAUZE/BANDAGES/DRESSINGS) IMPLANT
CHLORAPREP W/TINT 26 (MISCELLANEOUS) ×2 IMPLANT
CLAMP UMBILICAL CORD (MISCELLANEOUS) ×1 IMPLANT
CLIP FILSHIE TUBAL LIGA STRL (Clip) IMPLANT
CLOTH BEACON ORANGE TIMEOUT ST (SAFETY) ×1 IMPLANT
DRSG OPSITE POSTOP 4X10 (GAUZE/BANDAGES/DRESSINGS) ×1 IMPLANT
ELECTRODE REM PT RTRN 9FT ADLT (ELECTROSURGICAL) ×1 IMPLANT
EXTRACTOR VACUUM M CUP 4 TUBE (SUCTIONS) IMPLANT
GLOVE BIOGEL PI IND STRL 7.0 (GLOVE) ×1 IMPLANT
GLOVE SURG ORTHO 8.0 STRL STRW (GLOVE) ×1 IMPLANT
GOWN STRL REUS W/TWL LRG LVL3 (GOWN DISPOSABLE) ×2 IMPLANT
KIT ABG SYR 3ML LUER SLIP (SYRINGE) ×1 IMPLANT
NDL HYPO 25X5/8 SAFETYGLIDE (NEEDLE) ×1 IMPLANT
NEEDLE HYPO 25X5/8 SAFETYGLIDE (NEEDLE) ×1 IMPLANT
NS IRRIG 1000ML POUR BTL (IV SOLUTION) ×1 IMPLANT
PACK C SECTION WH (CUSTOM PROCEDURE TRAY) ×1 IMPLANT
PAD OB MATERNITY 4.3X12.25 (PERSONAL CARE ITEMS) ×1 IMPLANT
STRIP CLOSURE SKIN 1/2X4 (GAUZE/BANDAGES/DRESSINGS) IMPLANT
SUT MNCRL 0 VIOLET CTX 36 (SUTURE) ×3 IMPLANT
SUT MON AB 4-0 PS1 27 (SUTURE) ×1 IMPLANT
SUT PDS AB 1 CT 36 (SUTURE) IMPLANT
SUT VIC AB 1 CTX36XBRD ANBCTRL (SUTURE) IMPLANT
TOWEL OR 17X24 6PK STRL BLUE (TOWEL DISPOSABLE) ×1 IMPLANT
TRAY FOLEY W/BAG SLVR 14FR LF (SET/KITS/TRAYS/PACK) ×1 IMPLANT
WATER STERILE IRR 1000ML POUR (IV SOLUTION) ×1 IMPLANT

## 2024-02-04 NOTE — Anesthesia Procedure Notes (Signed)
 Spinal  Patient location during procedure: OR Start time: 02/04/2024 7:46 AM End time: 02/04/2024 7:48 AM Reason for block: surgical anesthesia Staffing Performed: anesthesiologist  Anesthesiologist: Lucious Debby BRAVO, MD Performed by: Lucious Debby BRAVO, MD Authorized by: Lucious Debby BRAVO, MD   Preanesthetic Checklist Completed: patient identified, IV checked, risks and benefits discussed, surgical consent, monitors and equipment checked, pre-op evaluation and timeout performed Spinal Block Patient position: sitting Prep: DuraPrep Patient monitoring: heart rate, cardiac monitor, continuous pulse ox and blood pressure Approach: midline Location: L3-4 Injection technique: single-shot Needle Needle type: Pencan  Needle gauge: 24 G Additional Notes Consent was obtained prior to the procedure with all questions answered and concerns addressed. Risks including, but not limited to, bleeding, infection, nerve damage, paralysis, failed block, inadequate analgesia, allergic reaction, high spinal, itching, and headache were discussed and the patient wished to proceed. Functioning IV was confirmed and monitors were applied. Sterile prep and drape, including hand hygiene, mask, and sterile gloves were used. The patient was positioned and the spine was prepped. The skin was anesthetized with lidocaine . Free flow of clear CSF was obtained prior to injecting local anesthetic into the CSF. The spinal needle aspirated freely following injection. The needle was carefully withdrawn. The patient tolerated the procedure well.   Debby Lucious, MD

## 2024-02-04 NOTE — Op Note (Signed)
 Cesarean Section Procedure Note  Pre-operative Diagnosis: IUP at 37 weeks, Gestational HTN, Previous C/S x 2 for repeat, Desires sterilization,   Post-operative Diagnosis: same  Surgeon: MARGET ALM BROCKS   Assistants: Surgical Techs  Anesthesia: spinal  Procedure:  Low Segment Transverse cesarean section and Sterilization with filshie clips  Procedure Details  The patient was seen in the Holding Room. The risks, benefits, complications, treatment options, and expected outcomes were discussed with the patient.  The patient concurred with the proposed plan, giving informed consent.  The site of surgery properly noted/marked.. A Time Out was held and the above information confirmed.  After induction of anesthesia, the patient was draped and prepped in the usual sterile manner. A Pfannenstiel incision was made and carried down through the subcutaneous tissue to the fascia. Fascial incision was made and extended transversely. The fascia was separated from the underlying rectus tissue superiorly and inferiorly. The peritoneum was identified and entered. Peritoneal incision was extended longitudinally. The utero-vesical peritoneal reflection was incised transversely and the bladder flap was bluntly freed from the lower uterine segment. A low transverse uterine incision was made. Delivered from vertex presentation with VE as a baby with Apgar scores of  8,8.  After the umbilical cord was clamped and cut cord blood was obtained for evaluation. The placenta was removed intact and appeared normal. The uterine outline, tubes and ovaries appeared normal. The uterine incision was closed with running locked sutures of 0 monocryl and imbricated with 0 monocryl. Hemostasis was observed. Lavage was carried out until clear.The fallopian tubes were identified by their fimbriated ends and filshie clips were placed across the tubes at the midportion of each tube.  Good placement was noted bilaterally.  The peritoneum was  then closed with 0 monocryl and rectus muscles plicated in the midline.  After hemostasis was assured, the fascia was then reapproximated with running sutures of Double stranded 0 PDS Irrigation was applied and after adequate hemostasis was assured, the skin was reapproximated with subcutaneous sutures using 4-0 monocryl.  Instrument, sponge, and needle counts were correct prior the abdominal closure and at the conclusion of the case. The patient received 2 grams cefotetan  preoperatively.  Findings: Viable  female,   Estimated Blood Loss:  787 cc         Specimens: Placenta was sent to labor and delivery         Complications:  None.  After the case, I was alerted the filshie clips used in the case had an expiration date of 5/25.  This was discussed with the patient and offered to re open and replace the clips, but she stated she did not want that and was ok with the clips used.

## 2024-02-04 NOTE — Anesthesia Preprocedure Evaluation (Addendum)
 Anesthesia Evaluation  Patient identified by MRN, date of birth, ID band Patient awake    Reviewed: Allergy & Precautions, NPO status , Patient's Chart, lab work & pertinent test results  History of Anesthesia Complications Negative for: history of anesthetic complications  Airway Mallampati: II   Neck ROM: Full    Dental  (+) Chipped   Pulmonary former smoker   Pulmonary exam normal        Cardiovascular negative cardio ROS Normal cardiovascular exam     Neuro/Psych  PSYCHIATRIC DISORDERS Anxiety Depression    negative neurological ROS     GI/Hepatic negative GI ROS,,,(+)     substance abuse  IV drug use  Endo/Other    Class 3 obesity  Renal/GU negative Renal ROS     Musculoskeletal negative musculoskeletal ROS (+)  narcotic dependent  Abdominal  (+) + obese  Peds  Hematology negative hematology ROS (+)  Plt 235k    Anesthesia Other Findings On methadone    Reproductive/Obstetrics (+) Pregnancy  Previous c/s                               Anesthesia Physical Anesthesia Plan  ASA: 3  Anesthesia Plan: Spinal   Post-op Pain Management: Tylenol  PO (pre-op)*   Induction:   PONV Risk Score and Plan: 2 and Treatment may vary due to age or medical condition, Ondansetron  and Scopolamine  patch - Pre-op  Airway Management Planned: Natural Airway  Additional Equipment: None  Intra-op Plan:   Post-operative Plan:   Informed Consent: I have reviewed the patients History and Physical, chart, labs and discussed the procedure including the risks, benefits and alternatives for the proposed anesthesia with the patient or authorized representative who has indicated his/her understanding and acceptance.       Plan Discussed with: CRNA and Anesthesiologist  Anesthesia Plan Comments: (Labs reviewed, platelets acceptable. Discussed risks and benefits of spinal, including  spinal/epidural hematoma, infection, failed block, and PDPH. Patient expressed understanding and wished to proceed. )         Anesthesia Quick Evaluation

## 2024-02-04 NOTE — Lactation Note (Signed)
 This note was copied from a baby's chart. Lactation Consultation Note  Patient Name: Jessica Huffman Unijb'd Date: 02/04/2024 Age:35 hours Reason for consult: Initial assessment;Early term 40-38.6wks Mom has been doing great about hand expressing and collecting colostrum. Mom has used hand pump to collect some colostrum. Instructed to give the baby back that BM before the formula and don't mix the two. Mom shown how to use DEBP & how to disassemble, clean, & reassemble parts. Suggested mom pump q 3hr. Newborn feeding habits, behavior, STS, I&O, milk storage reviewed. Experienced BF mom has a 90 yr old that she BF for 6 months feels confident about BF this baby. He has latched several times but is sleepy now. Explained normal. If baby hasn't cued in 3 hrs wake him up and try to feed. Mom encouraged to feed baby 8-12 times/24 hours and with feeding cues.  Put baby to the breast before supplementing. Encouraged mom to call for assistance or questions as needed.  Maternal Data Has patient been taught Hand Expression?: Yes Does the patient have breastfeeding experience prior to this delivery?: Yes How long did the patient breastfeed?: 6 months each  Feeding Mother's Current Feeding Choice: Breast Milk and Formula  LATCH Score       Type of Nipple: Everted at rest and after stimulation  Comfort (Breast/Nipple): Soft / non-tender         Lactation Tools Discussed/Used Tools: Pump;Flanges Flange Size: 24 Breast pump type: Double-Electric Breast Pump Pump Education: Setup, frequency, and cleaning;Milk Storage Reason for Pumping: supplementation Pumping frequency: q 3 hr Pumped volume: 5 mL  Interventions Interventions: Breast feeding basics reviewed;Skin to skin;Breast massage;Hand express;Breast compression;Expressed milk;DEBP;Hand pump;Education;LC Services brochure;CDC milk storage guidelines  Discharge Pump: DEBP  Consult Status Consult Status: Follow-up Date:  02/05/24 Follow-up type: In-patient    [name] 02/04/2024, 9:02 PM

## 2024-02-04 NOTE — Anesthesia Postprocedure Evaluation (Signed)
 Anesthesia Post Note  Patient: Jessica Huffman  Procedure(s) Performed: CESAREAN SECTION, WITH BILATERAL TUBAL LIGATION     Patient location during evaluation: PACU Anesthesia Type: Spinal Level of consciousness: awake and alert Pain management: pain level controlled Vital Signs Assessment: post-procedure vital signs reviewed and stable Respiratory status: spontaneous breathing and respiratory function stable Cardiovascular status: blood pressure returned to baseline and stable Postop Assessment: spinal receding and no apparent nausea or vomiting Anesthetic complications: no   No notable events documented.  Last Vitals:  Vitals:   02/04/24 1020 02/04/24 1037  BP:  125/89  Pulse: 87 91  Resp: 14 14  Temp: 36.4 C 36.7 C  SpO2: 99% 100%    Last Pain:  Vitals:   02/04/24 1037  TempSrc: Axillary  PainSc:    Pain Goal:                   Debby FORBES Like

## 2024-02-04 NOTE — Transfer of Care (Signed)
 Immediate Anesthesia Transfer of Care Note  Patient: Jessica Huffman  Procedure(s) Performed: CESAREAN SECTION, WITH BILATERAL TUBAL LIGATION  Patient Location: PACU  Anesthesia Type:Spinal  Level of Consciousness: awake, alert , and oriented  Airway & Oxygen Therapy: Patient Spontanous Breathing  Post-op Assessment: Report given to RN and Post -op Vital signs reviewed and stable  Post vital signs: Reviewed and stable  Last Vitals:  Vitals Value Taken Time  BP 124/76 02/04/24 09:13  Temp    Pulse 103 02/04/24 09:15  Resp 16 02/04/24 09:15  SpO2 96 % 02/04/24 09:15  Vitals shown include unfiled device data.  Last Pain:  Vitals:   02/04/24 0614  TempSrc: Oral         Complications: No notable events documented.

## 2024-02-04 NOTE — H&P (Signed)
 Jessica Huffman is a 33 y.o. female presenting for repeat c/s and BTL at 37 weeks due to gestational HTN.  No severe features and BP 140s/90s and normal labs.  Pregnancy also complicated by methadone  use to hx of heroin addiction.  Also, LGA (99%tile) but normal diabetes screen in pregnancy. OB History     Gravida  4   Para  2   Term  2   Preterm  0   AB  1   Living  2      SAB  0   IAB  1   Ectopic  0   Multiple  0   Live Births  2          Past Medical History:  Diagnosis Date   Depression    History of heroin abuse (HCC)    Methadone  maintenance treatment affecting pregnancy in second trimester (HCC) 10/02/2023   Past Surgical History:  Procedure Laterality Date   CESAREAN SECTION N/A 03/21/2017   Procedure: CESAREAN SECTION;  Surgeon: Jayne Vonn DEL, MD;  Location: [facility] BIRTHING SUITES;  Service: Obstetrics;  Laterality: N/A;   CESAREAN SECTION N/A 10/22/2020   Procedure: REPEAT CESAREAN SECTION EDC: 10-22-20  ALLERG: NKDA;  Surgeon: Curlene Agent, MD;  Location: [facility] LD ORS;  Service: Obstetrics;  Laterality: N/A;   Family History: family history includes Cancer in her father; Hypertension in her father and mother. Social History:  reports that she has quit smoking. Her smoking use included cigarettes. She has never used smokeless tobacco. She reports current drug use. Drug: Heroin. She reports that she does not drink alcohol.     Maternal Diabetes: No Genetic Screening: Normal Maternal Ultrasounds/Referrals: Normal Fetal Ultrasounds or other Referrals:  Referred to Materal Fetal Medicine  Maternal Substance Abuse:  Yes:  Type: Methadone  Significant Maternal Medications:  Meds include: Other: methadone  and prozac  Significant Maternal Lab Results:  Group B Strep negative Number of Prenatal Visits:greater than 3 verified prenatal visits Maternal Vaccinations:TDap Other Comments:  None  Review of Systems History   Blood pressure (!) 150/95, pulse (!) 110,  temperature 98.3 F (36.8 C), temperature source Oral, resp. rate 19, height 5' 4 (1.626 m), weight 117.1 kg, last menstrual period 05/27/2023, SpO2 98%, unknown if currently breastfeeding. Exam Physical Exam  Vitals and nursing note reviewed. Exam conducted with a chaperone present.  Constitutional:      Appearance: Normal appearance.  HENT:     Head: Normocephalic.  Eyes:     Pupils: Pupils are equal, round, and reactive to light.  Cardiovascular:     Rate and Rhythm: Normal rate and regular rhythm.     Pulses: Normal pulses.  Abdominal:     General: Abdomen is Gravid, nontender Neurological:     Mental Status: She is alert. Pt wishes Full resuscitation in the event of a code. Prenatal labs: ABO, Rh: --/--/A POS (08/25 9359) Antibody: NEG (08/25 0640) Rubella: Immune (02/26 0000) RPR: Nonreactive (02/26 0000)  HBsAg: Negative (02/26 0000)  HIV: Non-reactive (02/26 0000)  GBS:     Assessment/Plan: IUP at [redacted] weeks Gestational HTN - mild, with normal labs Previous c/s x 2 for repeat and desires sterilization Risks and benefits of C/S were discussed.  All questions were answered and informed consent was obtained.  Plan to proceed with low segment transverse Cesarean Section.  Multiparity and desires sterility.  Discussed risks and benefits of sterilization including but not limited to risk of tubal failure quoted as 10/998.  She gives her  informed consent. Alm JAYSON Cook 02/04/2024, 7:47 AM

## 2024-02-05 ENCOUNTER — Encounter (HOSPITAL_COMMUNITY): Payer: Self-pay | Admitting: Obstetrics and Gynecology

## 2024-02-05 LAB — HIV ANTIBODY (ROUTINE TESTING W REFLEX): HIV Screen 4th Generation wRfx: NONREACTIVE

## 2024-02-05 LAB — CBC
HCT: 31.3 % — ABNORMAL LOW (ref 36.0–46.0)
Hemoglobin: 10.4 g/dL — ABNORMAL LOW (ref 12.0–15.0)
MCH: 30.4 pg (ref 26.0–34.0)
MCHC: 33.2 g/dL (ref 30.0–36.0)
MCV: 91.5 fL (ref 80.0–100.0)
Platelets: 205 K/uL (ref 150–400)
RBC: 3.42 MIL/uL — ABNORMAL LOW (ref 3.87–5.11)
RDW: 13.4 % (ref 11.5–15.5)
WBC: 12 K/uL — ABNORMAL HIGH (ref 4.0–10.5)
nRBC: 0 % (ref 0.0–0.2)

## 2024-02-05 NOTE — Progress Notes (Signed)
 Postpartum Progress Note  Postpartum Day 1 s/p repeat Cesarean section with tubal sterilization.  Subjective:  Patient reports no overnight events.  She reports well controlled pain, ambulating without difficulty, voiding spontaneously, tolerating PO.  She reports Negative flatus, Negative BM.  Vaginal bleeding is minimal.  Objective: Blood pressure 126/87, pulse 94, temperature 98.2 F (36.8 C), temperature source Oral, resp. rate 16, height 5' 4 (1.626 m), weight 117.1 kg, last menstrual period 05/27/2023, SpO2 100%, unknown if currently breastfeeding.  Physical Exam:  General: alert and no distress Lochia: appropriate Abdomen: soft, ATTP Uterine Fundus: firm Incision: dressing in place DVT Evaluation: No evidence of DVT seen on physical exam.  Recent Labs    02/04/24 0614 02/05/24 0421  HGB 11.7* 10.4*  HCT 35.0* 31.3*    Assessment/Plan: Postpartum Day 1, s/p C-section Baby boy, desires circ however baby is not ready today Lactation following RPR added.   Trial abdominal binder Doing well, continue routine postpartum care. Anticipate discharge PPD# 2 or 3   LOS: 1 day   Evalene DELENA Smiles 02/05/2024, 9:00 AM

## 2024-02-05 NOTE — Clinical Social Work Maternal (Signed)
 CLINICAL SOCIAL WORK MATERNAL/CHILD NOTE  Patient Details  Name: Jessica Huffman MRN: [id number] Date of Birth: 11/08/1990  Date:  02/05/2024  Clinical Social Worker Initiating Note:  Zariyah Stephens Date/Time: Initiated:  02/05/24/1517     Child's Name:  Jessica Huffman 02/04/2024   Biological Parents:  Mother, Father Jessica Huffman January 04[birth year] 1989, Jessica Huffman 09/25/1983)   Need for Interpreter:  None   Reason for Referral:  Other (Comment) (Prescribed Methadone )   Address:  [address] [place] [address]    Phone number:  [phone] (home)     Additional phone number:   Household Members/Support Persons (HM/SP):   Household Member/Support Person 1, Household Member/Support Person 2, Household Member/Support Person 3   HM/SP Name Relationship DOB or Age  HM/SP -1 Jessica Huffman FOB 09/25/1983  HM/SP -2 Jessica Huffman 6  HM/SP -3 Jessica Huffman daughter 3  HM/SP -4        HM/SP -5        HM/SP -6        HM/SP -7        HM/SP -8          Natural Supports (not living in the home):  Extended Family, Investment banker, corporate Supports: None   Employment: Environmental education officer   Type of Work: Public affairs consultant Cae   Education:  Some Materials engineer arranged:    Surveyor, quantity Resources:  [employer]   Other Resources:  Archivist Considerations Which May Impact Care:    Strengths:  Ability to meet basic needs  , Psychotropic Medications, Home prepared for child     Psychotropic Medications:  Prozac       Pediatrician:       Pediatrician List:   Radiographer, therapeutic    [facility]      Pediatrician Fax Number:    Risk Factors/Current Problems:  None   Cognitive State:  Able to Concentrate  , Alert     Mood/Affect:  Calm  , Comfortable     CSW Assessment: CSW received a consult for Hx of anxiety and depression, as well as [name] on Methadone  maintenance. CSW met with [name] to complete assessment and  offer support. CSW entered the room and observed [name] at bedside and 2 family members at bedside holding the infant, CSW introduced self, CSW role and reason for visit, [name] was agreeable to visit and allowed guest to remain during the assessment. CSW inquired about how [name] was feeling, [name] reported good, just a little sore. CSW confirmed [name] address and phone number, [name] verified the information on file was correct. CSW inquired about [name] MH hx, [name] reported she was diagnosed about 2 years ago, [name] reported she currently takes Prozac  and reported the medication has be beneficial. CSW assessed for safety, [name] denied any SI or HI. CSW provided education regarding the baby blues period vs. perinatal mood disorders, discussed treatment and gave resources for mental health follow up if concerns arise.  CSW recommends self-evaluation during the postpartum time period using the New Mom Checklist from Postpartum Progress and encouraged [name] to contact a medical professional if symptoms are noted at any time. [name] identified her mom and FOB as primary supports.   CSW inquired about [name] Methadone  maintenance, [name] reported she has been on Methadone  for about 6 years and doing well. CSW explained the hospital drug screen policy, [name]  verbalized understanding. CSW informed [name] infant UDS was negative and CDS was pending, [name] verbalized understanding.   CSW provided review of Sudden Infant Death Syndrome (SIDS) precautions. [name] identified [facility] fro infants follow up. [name] reported she has all essential items for the infant including a bassinet, crib and car seat. CSW identifies no further need for intervention and no barriers to discharge at this time.  CSW Plan/Description:  No Further Intervention Required/No Barriers to Discharge, Sudden Infant Death Syndrome (SIDS) Education, Perinatal Mood and Anxiety Disorder (PMADs) Education, Neonatal Abstinence Syndrome (NAS) Education, Hospital Drug Screen Policy  Information, CSW Will Continue to Monitor Umbilical Cord Tissue Drug Screen Results and Make Report if Warranted    Deston Bilyeu M Mercadez Heitman, LCSW 02/05/2024, 3:24 PM

## 2024-02-05 NOTE — Lactation Note (Signed)
 This note was copied from a baby's chart. Lactation Consultation Note  Patient Name: Jessica Huffman Unijb'd Date: 02/05/2024 Age:35 hours Reason for consult: Follow-up assessment;Early term 18-38.6wks  P2, Mother on methadone . Mother has been breastfeeding, pumping and giving volume pumped to baby.  Baby consumed 17 ml at last feeding. Encouraged mother to continue latching and post pumping. Provided mother with additional coconut oil as requested. Reminded mother to call her insurance to inquire about a DEBP.  She has a manual pump. Call for latch assistance as needed.   Maternal Data Has patient been taught Hand Expression?: Yes  Feeding Mother's Current Feeding Choice: Breast Milk and Formula Nipple Type: Slow - flow  LATCH Score Latch: Repeated attempts needed to sustain latch, nipple held in mouth throughout feeding, stimulation needed to elicit sucking reflex.  Audible Swallowing: A few with stimulation  Type of Nipple: Everted at rest and after stimulation  Comfort (Breast/Nipple): Soft / non-tender  Hold (Positioning): Assistance needed to correctly position infant at breast and maintain latch.  LATCH Score: 7   Lactation Tools Discussed/Used Tools: Pump;Flanges Pumping frequency: q 3 hours for 15 min Pumped volume: 20 mL  Interventions Interventions: Education  Discharge Pump: Manual;Advised to call insurance company  Consult Status Consult Status: Follow-up Date: 02/06/24 Follow-up type: In-patient  Shannon Dines Boschen  RN, IBCLC 02/05/2024, 9:00 AM

## 2024-02-06 LAB — RPR: RPR Ser Ql: NONREACTIVE

## 2024-02-06 MED ORDER — OXYCODONE HCL 5 MG PO TABS: 5.0000 mg | ORAL_TABLET | ORAL | 0 refills | Status: AC | PRN

## 2024-02-06 MED ORDER — ONDANSETRON 4 MG PO TBDP: 4.0000 mg | ORAL_TABLET | Freq: Three times a day (TID) | ORAL | 0 refills | Status: AC | PRN

## 2024-02-06 NOTE — Lactation Note (Signed)
 This note was copied from a baby's chart. Lactation Consultation Note  Patient Name: Jessica Huffman Date: 02/06/2024 Age:35 hours Reason for consult: Follow-up assessment;Early term 37-38.6wks;Infant weight loss  P3- Infant was born at [redacted]w[redacted]d GA weighing [phone] and has had a total weight loss of 11.93%. Infant has had maternal exposure to Methadone , Adderral, Wellbutrin and Prozac . [name] feels like infant is showing very little withdrawal symptoms, except an occasional body shake. Infant is currently under photo therapy due to a TSB of 13.0. [name] reports that infant was latching well, but since he has been placed under the lights, he is not wanting to nurse. LC reviewed how the jaundice itself could make infant sleepy and not wanting to eat. LC reviewed infant's weight loss with [name] and encouraged her to keep the entire feeding 30 minutes or less to conserve energy and reduce further weight loss. [name] verbalized understanding.   [name] had attempted to latch infant on the left breast in the football hold. Infant did not want to open his mouth to latch. [name] tried this for 10 minutes before switching to the formula. [name] is now using Similac Neosure 22 kcal. [name] is using the Enfamil slow flow purple nipple. LC noted audible gulping when [name] was pace feeding him. LC believes that infant may benefit from a speech consult. LC encouraged [name] to pump after bottle feeding infant to ensure proper breast stimulation. [name] denies having any questions or concerns at this time. LC encouraged [name] to call for further assistance as needed.  Maternal Data Does the patient have breastfeeding experience prior to this delivery?: Yes  Feeding Mother's Current Feeding Choice: Breast Milk Nipple Type: Nfant Slow Flow (purple)  LATCH Score Latch: Too sleepy or reluctant, no latch achieved, no sucking elicited.  Audible Swallowing: None  Type of Nipple: Everted at rest and after stimulation  Comfort (Breast/Nipple):  Soft / non-tender  Hold (Positioning): No assistance needed to correctly position infant at breast.  LATCH Score: 6   Lactation Tools Discussed/Used Tools: Pump;Flanges Breast pump type: Double-Electric Breast Pump;Manual Pump Education: Setup, frequency, and cleaning;Milk Storage Pumping frequency: 15-20 mine every 3 hrs  Interventions Interventions: Breast feeding basics reviewed;Hand pump;DEBP;Education;Pace feeding;LC Services brochure  Discharge Discharge Education: Engorgement and breast care;Warning signs for feeding baby Pump: Advised to call insurance company;Manual  Consult Status Consult Status: Follow-up Date: 02/07/24 Follow-up type: In-patient    Recardo Hoit 02/06/2024, 10:05 PM

## 2024-02-06 NOTE — Discharge Summary (Signed)
 Postpartum Discharge Summary  Date of Service February 06, 2024     Patient Name: Jessica Huffman DOB: 05-Feb-1991 MRN: [id number]  Date of admission: 02/04/2024 Delivery date:02/04/2024 Delivering provider: MARGET LENIS Date of discharge: 02/06/2024  Admitting diagnosis: Previous cesarean section [Z98.891] Intrauterine pregnancy: [redacted]w[redacted]d     Secondary diagnosis:  Principal Problem:   Previous cesarean section  Additional problems: gestational hypertension    Discharge diagnosis: Term Pregnancy Delivered and Gestational Hypertension                                              Post partum procedures:not applicable Augmentation: N/A Complications: None  Hospital course: Sceduled C/S   33 y.o. yo [id number] at [redacted]w[redacted]d was admitted to the hospital 02/04/2024 for scheduled cesarean section with the following indication:Elective Repeat.Delivery details are as follows:  Membrane Rupture Time/Date: 8:20 AM,02/04/2024  Delivery Method:C-Section, Vacuum Assisted Operative Delivery:Device used:vacuum Indication: Fetal indications Details of operation can be found in separate operative note.  Patient had a postpartum course complicated by nothing.  She is ambulating, tolerating a regular diet, passing flatus, and urinating well. Patient is discharged home in stable condition on  02/06/24        Newborn Data: Birth date:02/04/2024 Birth time:8:24 AM Gender:Female Living status:Living Apgars:8 ,8  Weight:3940 g    Magnesium Sulfate received: No BMZ received: No Rhophylac:N/A MMR:N/A T-DaP:Given prenatally Flu: N/A RSV Vaccine received: No Transfusion:No Immunizations administered: Immunization History  Administered Date(s) Administered   Tdap 12/20/2016    Physical exam  Vitals:   02/05/24 0418 02/05/24 1402 02/05/24 2100 02/06/24 0531  BP: 126/87 120/78 125/79 121/77  Pulse: 94 85 88 88  Resp: 16 16 16 16   Temp: 98.2 F (36.8 C) 98.5 F (36.9 C) 98.3 F (36.8 C) 98.5 F (36.9 C)   TempSrc: Oral Oral Oral Oral  SpO2: 100% 100% 100% 100%  Weight:      Height:       General: alert, cooperative, and no distress Lochia: appropriate Uterine Fundus: firm Incision: Healing well with no significant drainage DVT Evaluation: No evidence of DVT seen on physical exam. Labs: Lab Results  Component Value Date   WBC 12.0 (H) 02/05/2024   HGB 10.4 (L) 02/05/2024   HCT 31.3 (L) 02/05/2024   MCV 91.5 02/05/2024   PLT 205 02/05/2024      Latest Ref Rng & Units 01/31/2024    6:32 PM  CMP  Glucose 70 - 99 mg/dL 83   BUN 6 - 20 mg/dL 8   Creatinine 9.55 - 8.99 mg/dL 9.28   Sodium 864 - 854 mmol/L 135   Potassium 3.5 - 5.1 mmol/L 4.0   Chloride 98 - 111 mmol/L 103   CO2 22 - 32 mmol/L 22   Calcium 8.9 - 10.3 mg/dL 8.5   Total Protein 6.5 - 8.1 g/dL 5.9   Total Bilirubin 0.0 - 1.2 mg/dL 0.8   Alkaline Phos 38 - 126 U/L 97   AST 15 - 41 U/L 25   ALT 0 - 44 U/L 17    Edinburgh Score:    02/04/2024   11:59 AM  Edinburgh Postnatal Depression Scale Screening Tool  I have been able to laugh and see the funny side of things. --      After visit meds:  Allergies as of 02/06/2024   No Known Allergies  Medication List     TAKE these medications    amphetamine-dextroamphetamine 20 MG 24 hr capsule Commonly known as: ADDERALL XR Take 20 mg by mouth daily. Takes 1-2 daily   calcium carbonate 500 MG chewable tablet Commonly known as: TUMS - dosed in mg elemental calcium Chew 2 tablets by mouth 3 (three) times daily as needed for indigestion or heartburn.   FLUoxetine  20 MG tablet Commonly known as: PROZAC  Take 20 mg by mouth daily.   methadone  10 MG/ML solution Commonly known as: DOLOPHINE  Take 23 mg by mouth daily.   ondansetron  4 MG disintegrating tablet Commonly known as: ZOFRAN -ODT Take 1 tablet (4 mg total) by mouth every 8 (eight) hours as needed for nausea or vomiting.   oxyCODONE  5 MG immediate release tablet Commonly known as: Oxy  IR/ROXICODONE  Take 1-2 tablets (5-10 mg total) by mouth every 4 (four) hours as needed for severe pain (pain score 7-10).   PRENATAL ADULT GUMMY/DHA/FA PO Take 2 tablets by mouth daily.         Discharge home in stable condition Infant Feeding: Breast Infant Disposition:home with mother Discharge instruction: per After Visit Summary and Postpartum booklet. Activity: Advance as tolerated. Pelvic rest for 6 weeks.  Diet: routine diet Anticipated Birth Control: Unsure Postpartum Appointment:6 weeks Additional Postpartum F/U: nothing  Future Appointments:No future appointments. Follow up Visit:      02/06/2024 Jessica LITTIE Cobble, MD

## 2024-02-07 MED ORDER — METHADONE HCL 10 MG/ML PO CONC
26.0000 mg | Freq: Every day | ORAL | Status: DC
  Administered 2024-02-07: 26 mg via ORAL
  Filled 2024-02-07: qty 5

## 2024-02-07 NOTE — Lactation Note (Addendum)
 This note was copied from a baby's chart. Lactation Consultation Note  Patient Name: Jessica Huffman Unijb'd Date: 02/07/2024 Age:35 hours Reason for consult: Follow-up assessment;Early term 76-38.6wks  P3, Mother on Methadone .  Infant weight increased. Phototherapy discontinued.  Mother feeding her breastmilk to baby when [facility] entered room with Dr. Orlinda Preemie bottle.   Mother recently pumped 60 ml. Mother states that since baby has started bottle feeding, he is less interested in breastfeeding.  Suggest calling for latch assistance as desired.   Noticed baby having difficulty coordinating his suck in upright feeding position.  Reviewed feeding in side lying. With flanged lips baby consumed 20 ml.    Suggest to mother to divide her volume into two storage containers after pumping.    Reminded mother to call her insurance company so she has a pump when discharged.   Maternal Data Has patient been taught Hand Expression?: Yes Does the patient have breastfeeding experience prior to this delivery?: Yes  Feeding Mother's Current Feeding Choice: Breast Milk Nipple Type: Dr. Jonna Preemie  Interventions Interventions: Education;Pace feeding  Discharge Pump: Advised to call insurance company (Reminded)  Consult Status Consult Status: Follow-up Date: 02/08/24 Follow-up type: In-patient  Shannon Levorn Lemme  RN, IBCLC 02/07/2024, 2:10 PM

## 2024-02-07 NOTE — Discharge Summary (Signed)
 Postpartum Discharge Summary  Date of Service February 07, 2024     Patient Name: Jessica Huffman DOB: [birth year] MRN: [id number]  Date of admission: 02/04/2024 Delivery date:02/04/2024 Delivering provider: MARGET LENIS Date of discharge: 02/07/2024  Admitting diagnosis: Previous cesarean section [Z98.891] Intrauterine pregnancy: [redacted]w[redacted]d     Secondary diagnosis:  Principal Problem:   Previous cesarean section  Additional problems: gestational hypertension    Discharge diagnosis: Term Pregnancy Delivered and Gestational Hypertension                                              Post partum procedures:not applicable Augmentation: N/A Complications: None  Hospital course: Sceduled C/S   33 y.o. yo [id number] at [redacted]w[redacted]d was admitted to the hospital 02/04/2024 for scheduled cesarean section with the following indication:Elective Repeat.Delivery details are as follows:  Membrane Rupture Time/Date: 8:20 AM,02/04/2024  Delivery Method:C-Section, Vacuum Assisted Operative Delivery:Device used:vacuum Indication: Fetal indications Details of operation can be found in separate operative note.  Patient had a postpartum course complicated by nothing.  She is ambulating, tolerating a regular diet, passing flatus, and urinating well. Patient is discharged home in stable condition on  02/07/24        Newborn Data: Birth date:02/04/2024 Birth time:8:24 AM Gender:Female Living status:Living Apgars:8 ,8  Weight:3940 g    Magnesium Sulfate received: No BMZ received: No Rhophylac:N/A MMR:N/A T-DaP:Given prenatally Flu: N/A RSV Vaccine received: No Transfusion:No Immunizations administered: Immunization History  Administered Date(s) Administered   Tdap 12/20/2016    Physical exam  Vitals:   02/06/24 1402 02/06/24 2148 02/06/24 2300 02/07/24 0543  BP: 131/71 133/88 128/80 121/65  Pulse: 94 90 90 (!) 105  Resp:  18  18  Temp: 97.7 F (36.5 C) 97.7 F (36.5 C)  (!) 97.2 F (36.2 C)  TempSrc: Oral  Oral  Oral  SpO2: 98% 98%  100%  Weight:      Height:       General: alert, cooperative, and no distress Lochia: appropriate Uterine Fundus: firm Incision: Healing well with no significant drainage DVT Evaluation: No evidence of DVT seen on physical exam. Labs: Lab Results  Component Value Date   WBC 12.0 (H) 02/05/2024   HGB 10.4 (L) 02/05/2024   HCT 31.3 (L) 02/05/2024   MCV 91.5 02/05/2024   PLT 205 02/05/2024      Latest Ref Rng & Units 01/31/2024    6:32 PM  CMP  Glucose 70 - 99 mg/dL 83   BUN 6 - 20 mg/dL 8   Creatinine 9.55 - 8.99 mg/dL 9.28   Sodium 864 - 854 mmol/L 135   Potassium 3.5 - 5.1 mmol/L 4.0   Chloride 98 - 111 mmol/L 103   CO2 22 - 32 mmol/L 22   Calcium 8.9 - 10.3 mg/dL 8.5   Total Protein 6.5 - 8.1 g/dL 5.9   Total Bilirubin 0.0 - 1.2 mg/dL 0.8   Alkaline Phos 38 - 126 U/L 97   AST 15 - 41 U/L 25   ALT 0 - 44 U/L 17    Edinburgh Score:    02/06/2024    8:20 AM  Edinburgh Postnatal Depression Scale Screening Tool  I have been able to laugh and see the funny side of things. 0  I have looked forward with enjoyment to things. 0  I have blamed myself unnecessarily when things  went wrong. 0  I have been anxious or worried for no good reason. 1  I have felt scared or panicky for no good reason. 0  Things have been getting on top of me. 0  I have been so unhappy that I have had difficulty sleeping. 0  I have felt sad or miserable. 0  I have been so unhappy that I have been crying. 0  The thought of harming myself has occurred to me. 0  Edinburgh Postnatal Depression Scale Total 1      After visit meds:  Allergies as of 02/07/2024   No Known Allergies      Medication List     TAKE these medications    amphetamine-dextroamphetamine 20 MG 24 hr capsule Commonly known as: ADDERALL XR Take 20 mg by mouth daily. Takes 1-2 daily   calcium carbonate 500 MG chewable tablet Commonly known as: TUMS - dosed in mg elemental calcium Chew 2  tablets by mouth 3 (three) times daily as needed for indigestion or heartburn.   FLUoxetine  20 MG tablet Commonly known as: PROZAC  Take 20 mg by mouth daily.   methadone  10 MG/ML solution Commonly known as: DOLOPHINE  Take 23 mg by mouth daily.   ondansetron  4 MG disintegrating tablet Commonly known as: ZOFRAN -ODT Take 1 tablet (4 mg total) by mouth every 8 (eight) hours as needed for nausea or vomiting.   oxyCODONE  5 MG immediate release tablet Commonly known as: Oxy IR/ROXICODONE  Take 1-2 tablets (5-10 mg total) by mouth every 4 (four) hours as needed for severe pain (pain score 7-10).   PRENATAL ADULT GUMMY/DHA/FA PO Take 2 tablets by mouth daily.         Discharge home in stable condition Infant Feeding: Breast Infant Disposition:home with mother Discharge instruction: per After Visit Summary and Postpartum booklet. Activity: Advance as tolerated. Pelvic rest for 6 weeks.  Diet: routine diet Anticipated Birth Control: Unsure Postpartum Appointment:6 weeks Additional Postpartum F/U: nothing  Future Appointments:No future appointments. Follow up Visit:      02/07/2024 Kelly Delon Milian, MD

## 2024-02-08 NOTE — Lactation Note (Addendum)
 This note was copied from a baby's chart. Lactation Consultation Note  Patient Name: Jessica Huffman Date: 02/08/2024 Age:35 days Reason for consult: Follow-up assessment;Early term 37-38.6wks  P3, Mother on Methadone .  Weight stable at 11.55% 3485 grams    Mother pumping with DEBP when LC entered room with 24 mm flanges.  Baby sleeping. Mother states she has been pumped 60 ml consistently.  Returned to room and baby had spit up. With the last feeding mother started fortifying her breastmilk with powdered 22 kcal formula.  Mother states she has been alternating with Dr. Orlinda Preemie bottle and purple slow flow nipple and baby consumes appropriate amounts with both nipples per mother.  Volumes consumed 20-45 ml.  Mother states baby has started latching on breast again and has been supplementing after sessions.  Reminded mother to call her insurance company about a pump for home use.  Mother states she may call [employer] to expedite the process.  Discussed the option of renting a pump from the gift shop or contacting WIC.  Suggest calling for latch assistance or help as needed.   Maternal Data Has patient been taught Hand Expression?: Yes  Feeding Mother's Current Feeding Choice: Breast Milk and Formula  Lactation Tools Discussed/Used Tools: Pump;Flanges Flange Size: 24 Pumping frequency: q 3 hours for 15 min Pumped volume: 60 mL  Interventions Interventions: Education;DEBP  Discharge Pump: Advised to call insurance company (Reminded)  Consult Status Consult Status: Follow-up Date: 02/09/24 Follow-up type: In-patient   Shannon Levorn Lemme  RN, IBCLC 02/08/2024, 12:37 PM

## 2024-02-09 NOTE — Lactation Note (Signed)
 This note was copied from a baby's chart. Lactation Consultation Note  Patient Name: Jessica Huffman Unijb'd Date: 02/09/2024 Age:35 days Reason for consult: Follow-up assessment;Early term 37-38.6wks;Infant weight loss (eat, sleep, console)  P3- Infant has had a total weight loss of 11.93%. This has been consistent for the past 4 days. [name]'s milk has now transitioned in and is pumping about 90 mL per session. [name] reports that she has fortified her breast milk once, but hasn't since. [name] reports that infant spit up after fortified milk and the LC yesterday told her that she does not have to fortify her milk because she is pumping so milk EBM. LC reviewed how she should still fortify her milk because infant needs the extra calories. [name] verbalized understanding and asked if she could try a different formula then. LC encouraged [name] to talk to the MD/NP about the spit up and switching the formula if that is what she wants. LC informed NP of [name]'s request.   LC reviewed breast care and engorgement with [name]. [name] is pumping but still feeling full, so LC provided her with ice to place on the breast for 15 minutes. Infant was cueing and [name] wanted LC to assess his latch. [name] placed infant on the left breast in the football hold. Infant latched immediately with flanged lips and a strong rhythmic suck. LC praised [name] and infant. LC encouraged [name] to call for further assistance as needed.  Maternal Data Has patient been taught Hand Expression?: Yes Does the patient have breastfeeding experience prior to this delivery?: Yes  Feeding Mother's Current Feeding Choice: Breast Milk Nipple Type: Extra Slow Flow  LATCH Score Latch: Grasps breast easily, tongue down, lips flanged, rhythmical sucking.  Audible Swallowing: Spontaneous and intermittent  Type of Nipple: Everted at rest and after stimulation  Comfort (Breast/Nipple): Soft / non-tender  Hold (Positioning): Assistance needed to correctly position  infant at breast and maintain latch.  LATCH Score: 9   Lactation Tools Discussed/Used Tools: Pump;Flanges Flange Size: 18 Breast pump type: Double-Electric Breast Pump;Manual Pump Education: Setup, frequency, and cleaning;Milk Storage Reason for Pumping: weight loss Pumping frequency: 15-20 min every 3 hrs Pumped volume: 90 mL  Interventions Interventions: Breast feeding basics reviewed;Assisted with latch;Breast compression;Adjust position;Support pillows;Position options;Expressed milk;Hand pump;DEBP;Ice;Education;LC Services brochure  Discharge Discharge Education: Engorgement and breast care;Warning signs for feeding baby;Outpatient recommendation Pump: Manual;Advised to call insurance company  Consult Status Consult Status: Complete Date: 02/09/24    Recardo Hoit BS, IBCLC 02/09/2024, 9:32 AM

## 2024-02-16 ENCOUNTER — Telehealth (HOSPITAL_COMMUNITY): Payer: Self-pay | Admitting: *Deleted

## 2024-02-16 NOTE — Telephone Encounter (Signed)
 Attempted hospital discharge follow-up call. Left message for patient to return RN call with any questions or concerns. Allean IVAR Carton, RN, 02/16/24, 1009
